# Patient Record
Sex: Male | Born: 1980 | Hispanic: Yes | Marital: Single
Health system: Southern US, Community
[De-identification: ages and names within clinical notes are randomized; demographics above are authoritative.]

---

## 2011-10-30 ENCOUNTER — Inpatient Hospital Stay: Payer: Self-pay | Admitting: Internal Medicine

## 2011-10-30 LAB — COMPREHENSIVE METABOLIC PANEL
Albumin: 1.8 g/dL — ABNORMAL LOW (ref 3.4–5.0)
Alkaline Phosphatase: 283 U/L — ABNORMAL HIGH (ref 50–136)
Bilirubin,Total: 19.8 mg/dL — ABNORMAL HIGH (ref 0.2–1.0)
Chloride: 95 mmol/L — ABNORMAL LOW (ref 98–107)
Creatinine: 0.34 mg/dL — ABNORMAL LOW (ref 0.60–1.30)
EGFR (African American): >60
EGFR (Non-African Amer.): >60
Glucose: 93 mg/dL (ref 65–99)
Potassium: 3.3 mmol/L — ABNORMAL LOW (ref 3.5–5.1)
SGOT(AST): 188 U/L — ABNORMAL HIGH (ref 15–37)
SGPT (ALT): 32 U/L (ref 12–78)
Sodium: 130 mmol/L — ABNORMAL LOW (ref 136–145)
Total Protein: 7.3 g/dL (ref 6.4–8.2)

## 2011-10-30 LAB — URINALYSIS, COMPLETE
Bacteria: NONE SEEN
Glucose,UR: NEGATIVE mg/dL (ref 0–75)
Nitrite: NEGATIVE
Protein: 30
Specific Gravity: 1.017 (ref 1.003–1.030)
Squamous Epithelial: NONE SEEN

## 2011-10-30 LAB — PROTIME-INR
INR: 1.7
Prothrombin Time: 20.7 secs — ABNORMAL HIGH (ref 11.5–14.7)

## 2011-10-30 LAB — CBC
HCT: 33.1 % — ABNORMAL LOW (ref 40.0–52.0)
MCH: 32.9 pg (ref 26.0–34.0)
MCHC: 34.3 g/dL (ref 32.0–36.0)
RDW: 16.9 % — ABNORMAL HIGH (ref 11.5–14.5)

## 2011-10-30 LAB — AMMONIA: Ammonia, Plasma: 46 mcmol/L — ABNORMAL HIGH (ref 11–32)

## 2011-10-31 LAB — COMPREHENSIVE METABOLIC PANEL
Alkaline Phosphatase: 237 U/L — ABNORMAL HIGH (ref 50–136)
Anion Gap: 11 (ref 7–16)
BUN: 5 mg/dL — ABNORMAL LOW (ref 7–18)
Chloride: 98 mmol/L (ref 98–107)
Co2: 26 mmol/L (ref 21–32)
Creatinine: 0.5 mg/dL — ABNORMAL LOW (ref 0.60–1.30)
EGFR (African American): >60
Potassium: 2.9 mmol/L — ABNORMAL LOW (ref 3.5–5.1)
SGPT (ALT): 28 U/L (ref 12–78)
Sodium: 135 mmol/L — ABNORMAL LOW (ref 136–145)
Total Protein: 6.2 g/dL — ABNORMAL LOW (ref 6.4–8.2)

## 2011-10-31 LAB — CBC WITH DIFFERENTIAL/PLATELET
Basophil #: 0.1 10*3/uL (ref 0.0–0.1)
Eosinophil #: 0.3 10*3/uL (ref 0.0–0.7)
MCHC: 34.8 g/dL (ref 32.0–36.0)
MCV: 96 fL (ref 80–100)
Monocyte #: 1.5 x10 3/mm — ABNORMAL HIGH (ref 0.2–1.0)
Monocyte %: 10.9 %
Neutrophil #: 10.7 10*3/uL — ABNORMAL HIGH (ref 1.4–6.5)
Neutrophil %: 78.6 %
Platelet: 113 10*3/uL — ABNORMAL LOW (ref 150–440)
RBC: 3.31 10*6/uL — ABNORMAL LOW (ref 4.40–5.90)
WBC: 13.6 10*3/uL — ABNORMAL HIGH (ref 3.8–10.6)

## 2011-11-01 LAB — BASIC METABOLIC PANEL
BUN: 6 mg/dL — ABNORMAL LOW (ref 7–18)
Creatinine: 0.46 mg/dL — ABNORMAL LOW (ref 0.60–1.30)
EGFR (African American): >60
EGFR (Non-African Amer.): >60
Glucose: 84 mg/dL (ref 65–99)
Potassium: 3.3 mmol/L — ABNORMAL LOW (ref 3.5–5.1)
Sodium: 134 mmol/L — ABNORMAL LOW (ref 136–145)

## 2011-11-01 LAB — HEPATIC FUNCTION PANEL A (ARMC)
Bilirubin, Direct: 13 mg/dL — ABNORMAL HIGH (ref 0.00–0.20)
Bilirubin,Total: 17.1 mg/dL — ABNORMAL HIGH (ref 0.2–1.0)
SGOT(AST): 104 U/L — ABNORMAL HIGH (ref 15–37)

## 2011-11-01 LAB — CLOSTRIDIUM DIFFICILE BY PCR

## 2011-11-01 LAB — PROTIME-INR
INR: 2
Prothrombin Time: 22.9 secs — ABNORMAL HIGH (ref 11.5–14.7)

## 2011-11-02 LAB — COMPREHENSIVE METABOLIC PANEL
Albumin: 1.6 g/dL — ABNORMAL LOW (ref 3.4–5.0)
Alkaline Phosphatase: 247 U/L — ABNORMAL HIGH (ref 50–136)
BUN: 6 mg/dL — ABNORMAL LOW (ref 7–18)
Bilirubin,Total: 19.4 mg/dL — ABNORMAL HIGH (ref 0.2–1.0)
Co2: 24 mmol/L (ref 21–32)
Creatinine: 0.46 mg/dL — ABNORMAL LOW (ref 0.60–1.30)
EGFR (Non-African Amer.): >60
Glucose: 68 mg/dL (ref 65–99)
Osmolality: 273 (ref 275–301)
SGOT(AST): 91 U/L — ABNORMAL HIGH (ref 15–37)
SGPT (ALT): 28 U/L (ref 12–78)
Sodium: 139 mmol/L (ref 136–145)
Total Protein: 6.7 g/dL (ref 6.4–8.2)

## 2011-11-03 LAB — COMPREHENSIVE METABOLIC PANEL
Alkaline Phosphatase: 238 U/L — ABNORMAL HIGH (ref 50–136)
Anion Gap: 11 (ref 7–16)
BUN: 7 mg/dL (ref 7–18)
Bilirubin,Total: 19.6 mg/dL — ABNORMAL HIGH (ref 0.2–1.0)
Calcium, Total: 8.2 mg/dL — ABNORMAL LOW (ref 8.5–10.1)
Chloride: 102 mmol/L (ref 98–107)
Co2: 23 mmol/L (ref 21–32)
Creatinine: 0.51 mg/dL — ABNORMAL LOW (ref 0.60–1.30)
Glucose: 81 mg/dL (ref 65–99)
Osmolality: 269 (ref 275–301)
Potassium: 3.6 mmol/L (ref 3.5–5.1)
Sodium: 136 mmol/L (ref 136–145)

## 2011-11-04 LAB — COMPREHENSIVE METABOLIC PANEL
Alkaline Phosphatase: 240 U/L — ABNORMAL HIGH (ref 50–136)
BUN: 8 mg/dL (ref 7–18)
Bilirubin,Total: 18.9 mg/dL — ABNORMAL HIGH (ref 0.2–1.0)
Chloride: 103 mmol/L (ref 98–107)
Co2: 22 mmol/L (ref 21–32)
Creatinine: 0.48 mg/dL — ABNORMAL LOW (ref 0.60–1.30)
EGFR (Non-African Amer.): >60
SGOT(AST): 62 U/L — ABNORMAL HIGH (ref 15–37)
SGPT (ALT): 27 U/L (ref 12–78)
Total Protein: 6.4 g/dL (ref 6.4–8.2)

## 2011-11-04 LAB — CBC WITH DIFFERENTIAL/PLATELET
Bands: 9 %
Eosinophil: 1 %
HCT: 35 % — ABNORMAL LOW (ref 40.0–52.0)
HGB: 11.9 g/dL — ABNORMAL LOW (ref 13.0–18.0)
MCHC: 34 g/dL (ref 32.0–36.0)
Platelet: 198 10*3/uL (ref 150–440)
Segmented Neutrophils: 69 %

## 2011-11-18 ENCOUNTER — Inpatient Hospital Stay: Payer: Self-pay | Admitting: Internal Medicine

## 2011-11-18 LAB — ETHANOL
Ethanol %: 0.037 % (ref 0.000–0.080)
Ethanol: 37 mg/dL

## 2011-11-18 LAB — CBC
HCT: 33.7 % — ABNORMAL LOW (ref 40.0–52.0)
HGB: 11.6 g/dL — ABNORMAL LOW (ref 13.0–18.0)
MCH: 33 pg (ref 26.0–34.0)
MCHC: 34.5 g/dL (ref 32.0–36.0)
RDW: 15.6 % — ABNORMAL HIGH (ref 11.5–14.5)

## 2011-11-18 LAB — URINALYSIS, COMPLETE
Cellular Cast: 1
Leukocyte Esterase: NEGATIVE
Nitrite: NEGATIVE
Ph: 6 (ref 4.5–8.0)
Protein: 25
RBC,UR: 3 /HPF (ref 0–5)
WBC UR: 5 /HPF (ref 0–5)

## 2011-11-18 LAB — LIPASE, BLOOD: Lipase: 357 U/L (ref 73–393)

## 2011-11-18 LAB — COMPREHENSIVE METABOLIC PANEL
Alkaline Phosphatase: 307 U/L — ABNORMAL HIGH (ref 50–136)
BUN: 7 mg/dL (ref 7–18)
Bilirubin,Total: 18.2 mg/dL — ABNORMAL HIGH (ref 0.2–1.0)
Chloride: 106 mmol/L (ref 98–107)
Co2: 18 mmol/L — ABNORMAL LOW (ref 21–32)
EGFR (African American): >60
EGFR (Non-African Amer.): >60
SGOT(AST): 156 U/L — ABNORMAL HIGH (ref 15–37)
SGPT (ALT): 71 U/L (ref 12–78)
Total Protein: 6.8 g/dL (ref 6.4–8.2)

## 2011-11-18 LAB — AMMONIA: Ammonia, Plasma: 53 mcmol/L — ABNORMAL HIGH (ref 11–32)

## 2011-11-19 LAB — CBC WITH DIFFERENTIAL/PLATELET
Basophil %: 0.4 %
Eosinophil %: 0.2 %
HCT: 31.8 % — ABNORMAL LOW (ref 40.0–52.0)
HGB: 11.1 g/dL — ABNORMAL LOW (ref 13.0–18.0)
Lymphocyte %: 5.3 %
MCH: 33.6 pg (ref 26.0–34.0)
MCHC: 34.9 g/dL (ref 32.0–36.0)
Monocyte %: 1.1 %
Neutrophil #: 14.1 10*3/uL — ABNORMAL HIGH (ref 1.4–6.5)
Neutrophil %: 93 %
RBC: 3.3 10*6/uL — ABNORMAL LOW (ref 4.40–5.90)
WBC: 15.2 10*3/uL — ABNORMAL HIGH (ref 3.8–10.6)

## 2011-11-19 LAB — COMPREHENSIVE METABOLIC PANEL
Alkaline Phosphatase: 280 U/L — ABNORMAL HIGH (ref 50–136)
Calcium, Total: 7.2 mg/dL — ABNORMAL LOW (ref 8.5–10.1)
Chloride: 105 mmol/L (ref 98–107)
Co2: 19 mmol/L — ABNORMAL LOW (ref 21–32)
EGFR (African American): >60
EGFR (Non-African Amer.): >60
Osmolality: 268 (ref 275–301)
Potassium: 3.5 mmol/L (ref 3.5–5.1)
SGOT(AST): 138 U/L — ABNORMAL HIGH (ref 15–37)
SGPT (ALT): 64 U/L (ref 12–78)
Sodium: 134 mmol/L — ABNORMAL LOW (ref 136–145)
Total Protein: 6.1 g/dL — ABNORMAL LOW (ref 6.4–8.2)

## 2011-11-19 LAB — MAGNESIUM: Magnesium: 1.7 mg/dL — ABNORMAL LOW

## 2011-11-19 LAB — URINE CULTURE

## 2011-11-20 LAB — COMPREHENSIVE METABOLIC PANEL
Anion Gap: 11 (ref 7–16)
Chloride: 109 mmol/L — ABNORMAL HIGH (ref 98–107)
Co2: 17 mmol/L — ABNORMAL LOW (ref 21–32)
Creatinine: 0.48 mg/dL — ABNORMAL LOW (ref 0.60–1.30)
EGFR (African American): >60
EGFR (Non-African Amer.): >60
Potassium: 3.8 mmol/L (ref 3.5–5.1)
SGOT(AST): 83 U/L — ABNORMAL HIGH (ref 15–37)
SGPT (ALT): 50 U/L (ref 12–78)
Sodium: 137 mmol/L (ref 136–145)
Total Protein: 5.6 g/dL — ABNORMAL LOW (ref 6.4–8.2)

## 2011-11-20 LAB — CBC WITH DIFFERENTIAL/PLATELET
Basophil #: 0 10*3/uL (ref 0.0–0.1)
Basophil %: 0.1 %
Eosinophil #: 0 10*3/uL (ref 0.0–0.7)
Lymphocyte #: 1.1 10*3/uL (ref 1.0–3.6)
Lymphocyte %: 4.4 %
MCH: 31.7 pg (ref 26.0–34.0)
MCHC: 32.7 g/dL (ref 32.0–36.0)
Monocyte #: 1.4 x10 3/mm — ABNORMAL HIGH (ref 0.2–1.0)
Neutrophil %: 90.1 %
RDW: 15.1 % — ABNORMAL HIGH (ref 11.5–14.5)

## 2011-11-20 LAB — CLOSTRIDIUM DIFFICILE BY PCR

## 2011-11-20 LAB — MAGNESIUM: Magnesium: 1.6 mg/dL — ABNORMAL LOW

## 2011-11-22 LAB — COMPREHENSIVE METABOLIC PANEL
Alkaline Phosphatase: 230 U/L — ABNORMAL HIGH (ref 50–136)
Anion Gap: 11 (ref 7–16)
Bilirubin,Total: 13.7 mg/dL — ABNORMAL HIGH (ref 0.2–1.0)
Calcium, Total: 7.3 mg/dL — ABNORMAL LOW (ref 8.5–10.1)
Chloride: 110 mmol/L — ABNORMAL HIGH (ref 98–107)
Co2: 18 mmol/L — ABNORMAL LOW (ref 21–32)
Creatinine: 0.58 mg/dL — ABNORMAL LOW (ref 0.60–1.30)
EGFR (African American): >60
Potassium: 3.4 mmol/L — ABNORMAL LOW (ref 3.5–5.1)
SGPT (ALT): 49 U/L (ref 12–78)

## 2011-11-22 LAB — CBC WITH DIFFERENTIAL/PLATELET
Basophil #: 0 10*3/uL (ref 0.0–0.1)
Basophil %: 0.1 %
Eosinophil #: 0.7 10*3/uL (ref 0.0–0.7)
Eosinophil %: 3.8 %
HGB: 10.9 g/dL — ABNORMAL LOW (ref 13.0–18.0)
Lymphocyte #: 1.7 10*3/uL (ref 1.0–3.6)
MCH: 32.9 pg (ref 26.0–34.0)
MCHC: 34.1 g/dL (ref 32.0–36.0)
MCV: 96 fL (ref 80–100)
Monocyte #: 1.5 x10 3/mm — ABNORMAL HIGH (ref 0.2–1.0)
Platelet: 125 10*3/uL — ABNORMAL LOW (ref 150–440)
RDW: 15.1 % — ABNORMAL HIGH (ref 11.5–14.5)

## 2011-11-23 LAB — COMPREHENSIVE METABOLIC PANEL
Albumin: 1.3 g/dL — ABNORMAL LOW (ref 3.4–5.0)
Alkaline Phosphatase: 263 U/L — ABNORMAL HIGH (ref 50–136)
BUN: 10 mg/dL (ref 7–18)
Bilirubin,Total: 13.6 mg/dL — ABNORMAL HIGH (ref 0.2–1.0)
Co2: 16 mmol/L — ABNORMAL LOW (ref 21–32)
EGFR (African American): >60
Glucose: 93 mg/dL (ref 65–99)
Osmolality: 276 (ref 275–301)
Potassium: 4 mmol/L (ref 3.5–5.1)
SGPT (ALT): 49 U/L (ref 12–78)
Total Protein: 5.9 g/dL — ABNORMAL LOW (ref 6.4–8.2)

## 2011-11-23 LAB — CBC WITH DIFFERENTIAL/PLATELET
Basophil #: 0 10*3/uL (ref 0.0–0.1)
Eosinophil #: 0.6 10*3/uL (ref 0.0–0.7)
HCT: 32.6 % — ABNORMAL LOW (ref 40.0–52.0)
HGB: 11.2 g/dL — ABNORMAL LOW (ref 13.0–18.0)
Lymphocyte #: 1.5 10*3/uL (ref 1.0–3.6)
MCH: 33.2 pg (ref 26.0–34.0)
MCHC: 34.2 g/dL (ref 32.0–36.0)
MCV: 97 fL (ref 80–100)
Monocyte #: 1.4 x10 3/mm — ABNORMAL HIGH (ref 0.2–1.0)
Neutrophil #: 13.9 10*3/uL — ABNORMAL HIGH (ref 1.4–6.5)
Neutrophil %: 79.6 %
RDW: 15.2 % — ABNORMAL HIGH (ref 11.5–14.5)
WBC: 17.5 10*3/uL — ABNORMAL HIGH (ref 3.8–10.6)

## 2011-11-24 LAB — CULTURE, BLOOD (SINGLE)

## 2013-05-09 IMAGING — US ABDOMEN ULTRASOUND
1 series · 13 of 25 positions shown · non-contrast
Comparison: none

REASON FOR EXAM: Jaundice, abnormal LFT's.
COMMENTS:

PROCEDURE:     US  - US ABDOMEN GENERAL SURVEY  - October 31, 2011  [DATE]
RESULT:     Comparison: None.
TECHNIQUE: Multiple grayscale and color Doppler images were obtained of the
abdomen.

[Series 1: abdomen ultrasound · 0.31mm/px · 13 of 97 slices shown]
[im 1/97]
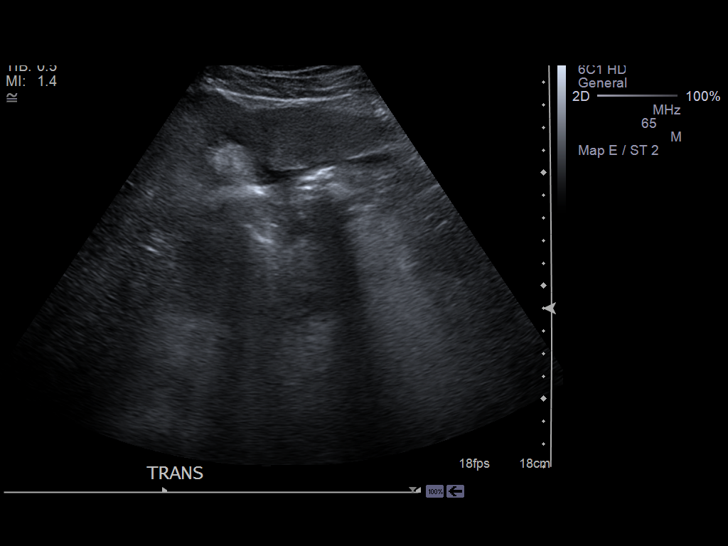
[im 9/97]
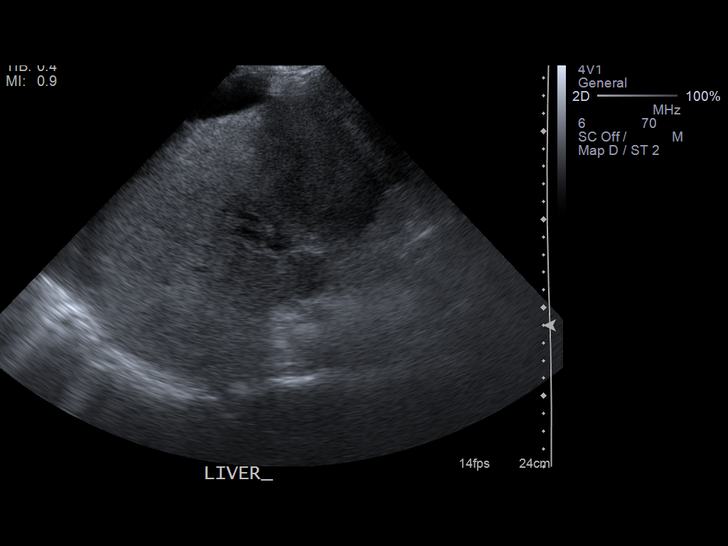
[im 17/97]
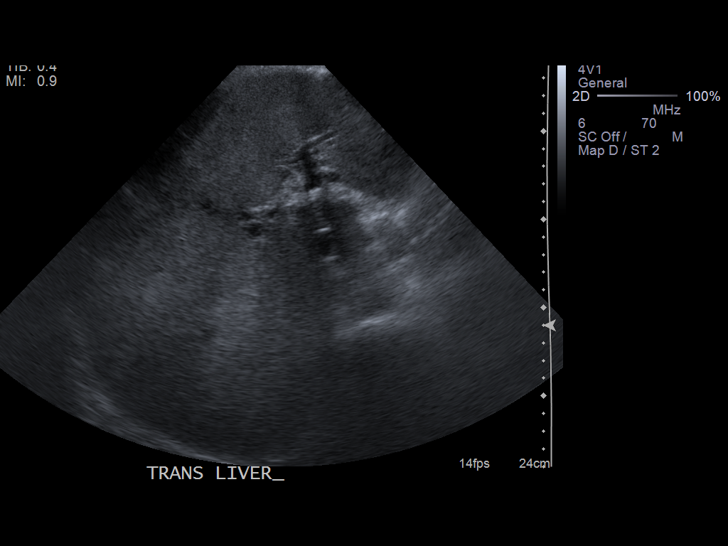
[im 25/97]
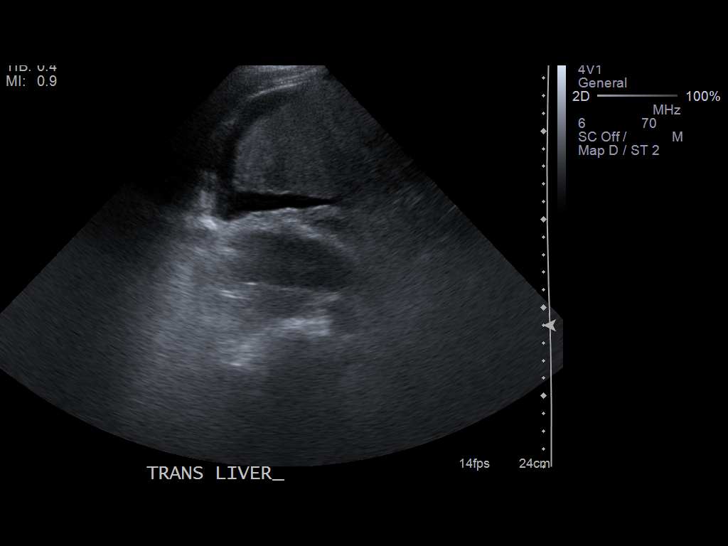
[im 33/97]
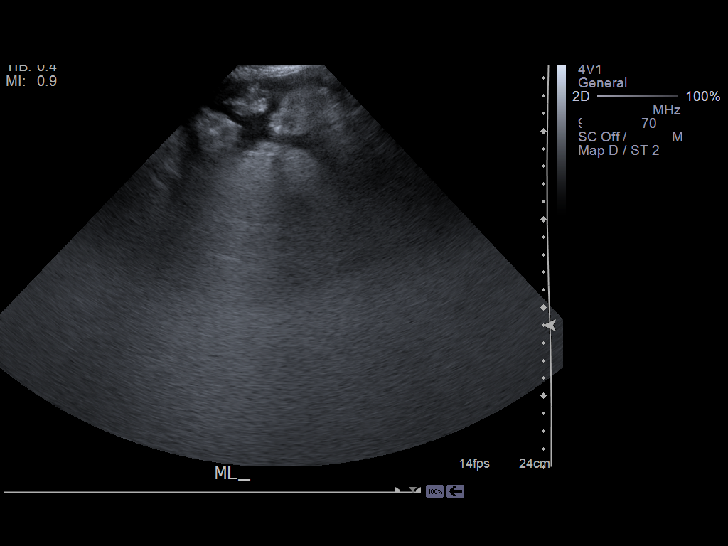
[im 41/97]
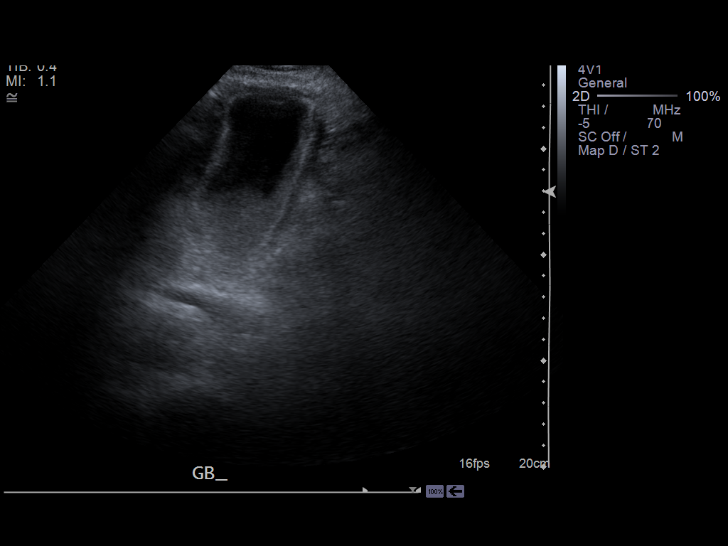
[im 49/97]
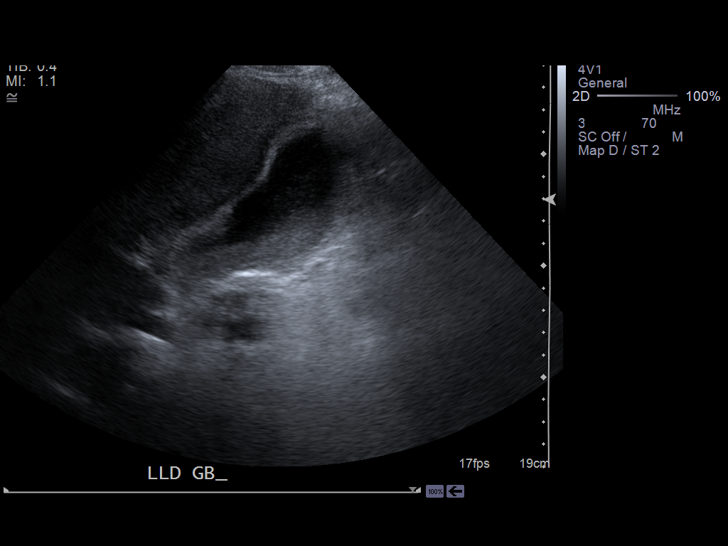
[im 57/97]
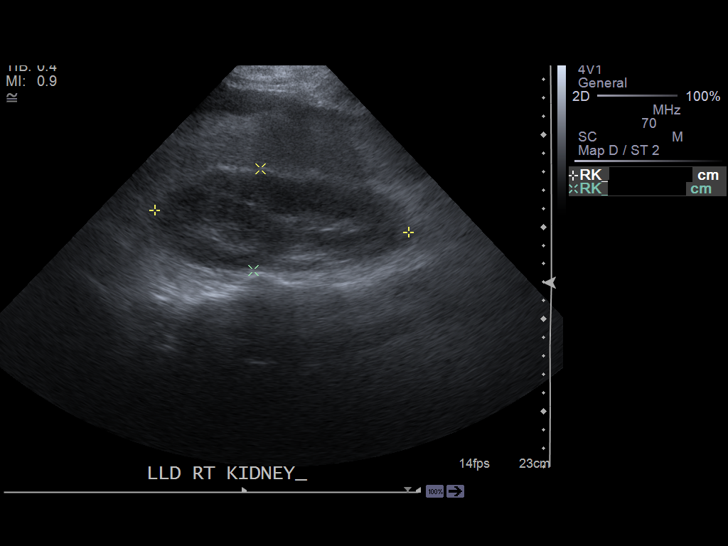
[im 65/97]
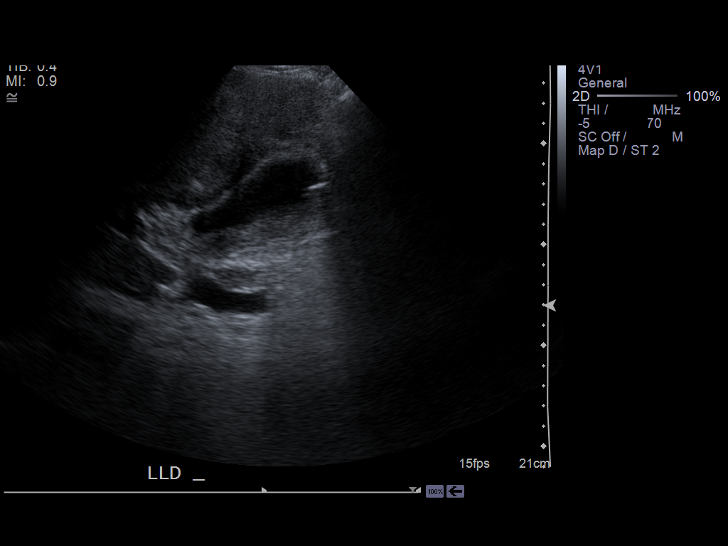
[im 73/97]
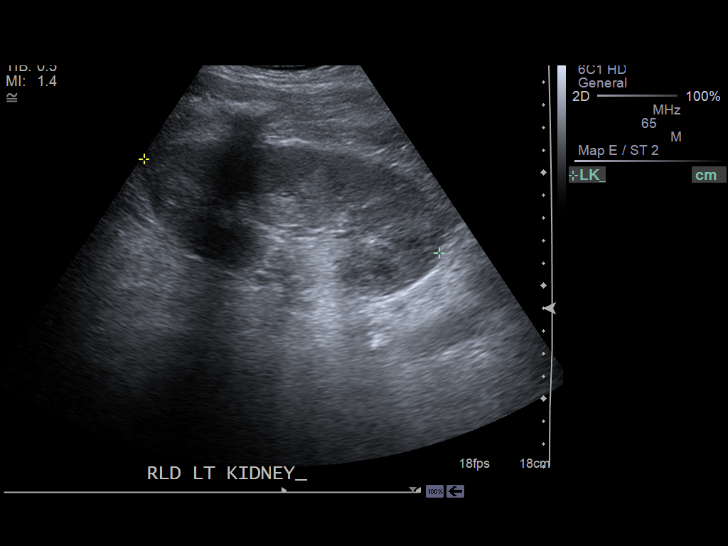
[im 81/97]
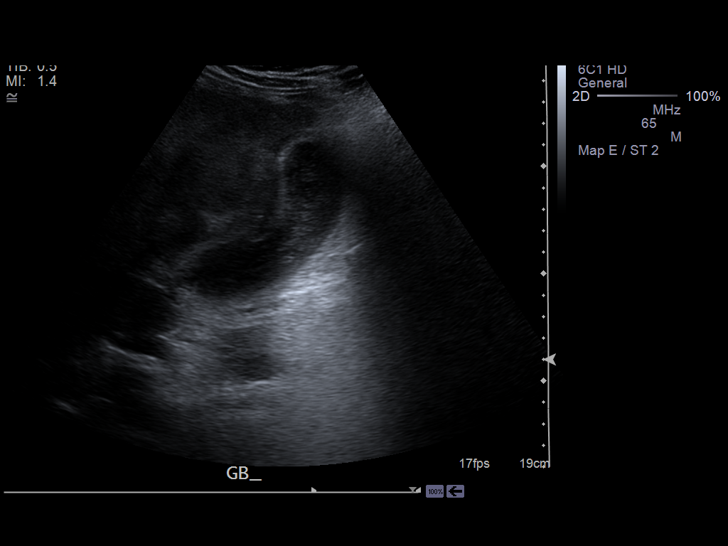
[im 89/97]
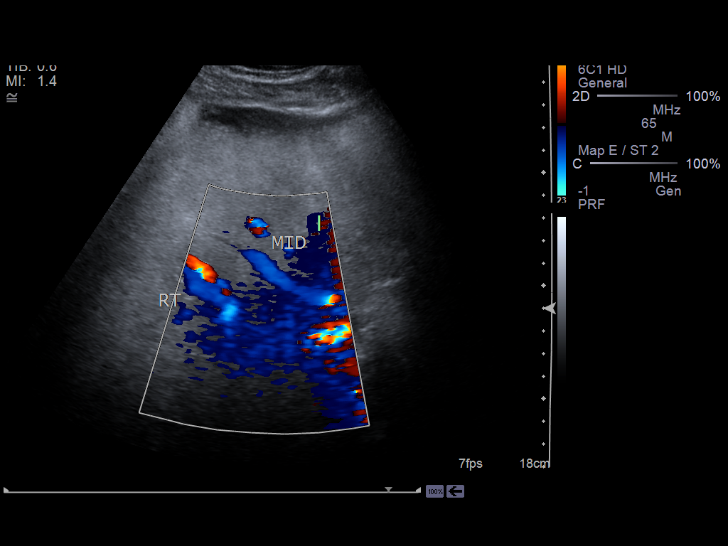
[im 97/97]
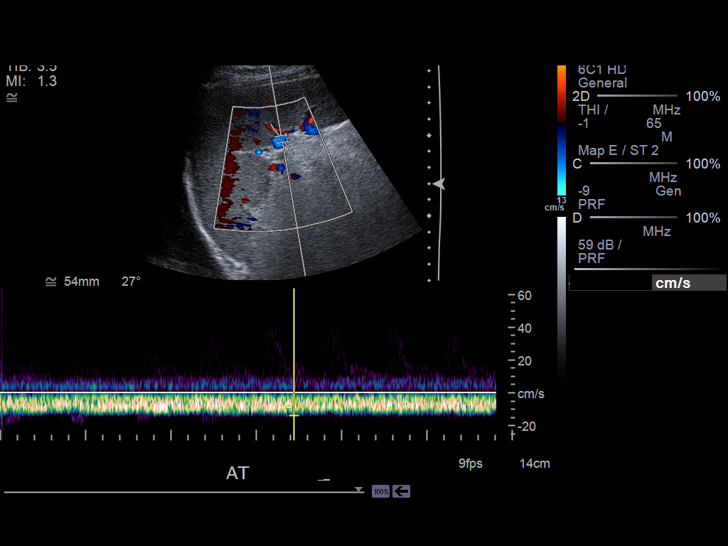

[13 of 25 positions shown; findings below may reference images not displayed]

FINDINGS: The liver is heterogeneous in echotexture. The contour appears somewhat
nodular. Findings are concerning for cirrhosis. There is a small amount of
ascites present. There is reversal of flow in the main portal vein. Flow is
demonstrated in the hepatic veins, though there is lack of respiratory
pulsatility.

There are stones and sludge in the gallbladder. The gallbladder wall is at
the upper limits of normal, measuring 3 mm in thickness. The sonographic
Murphy sign was negative. The common bile duct measures 4 mm.

The spleen is borderline enlarged, measuring 13.6 cm.  The flow is seen in
the splenic vein.

Images of the kidneys showed no hydronephrosis. The pancreas was obscured by
overlying bowel gas.
IMPRESSION: 1. Findings of cirrhosis with reversal of flow in the portal vein as well as
mild splenomegaly.
2. Cholelithiasis and sludge in gallbladder. The gallbladder wall is at the
upper limits of normal in thickness, although the sonographic Murphy sign
was negative. Causes of gallbladder wall thickening include cirrhosis,
ascites, cholecystitis, among others. Correlate clinically.
3. Mild amount of ascites.

[REDACTED]

## 2013-05-27 IMAGING — CR DG CHEST 2V
1 series · 2 of 2 positions shown · non-contrast
Comparison: none

REASON FOR EXAM: SOB
COMMENTS:

PROCEDURE:     DXR - DXR CHEST PA (OR AP) AND LATERAL  - November 18, 2011  [DATE]
RESULT:     Comparison: None.

[Series 1: w chest pa · 0.14mm/px · 2 of 2 slices shown]
[im 1/2]
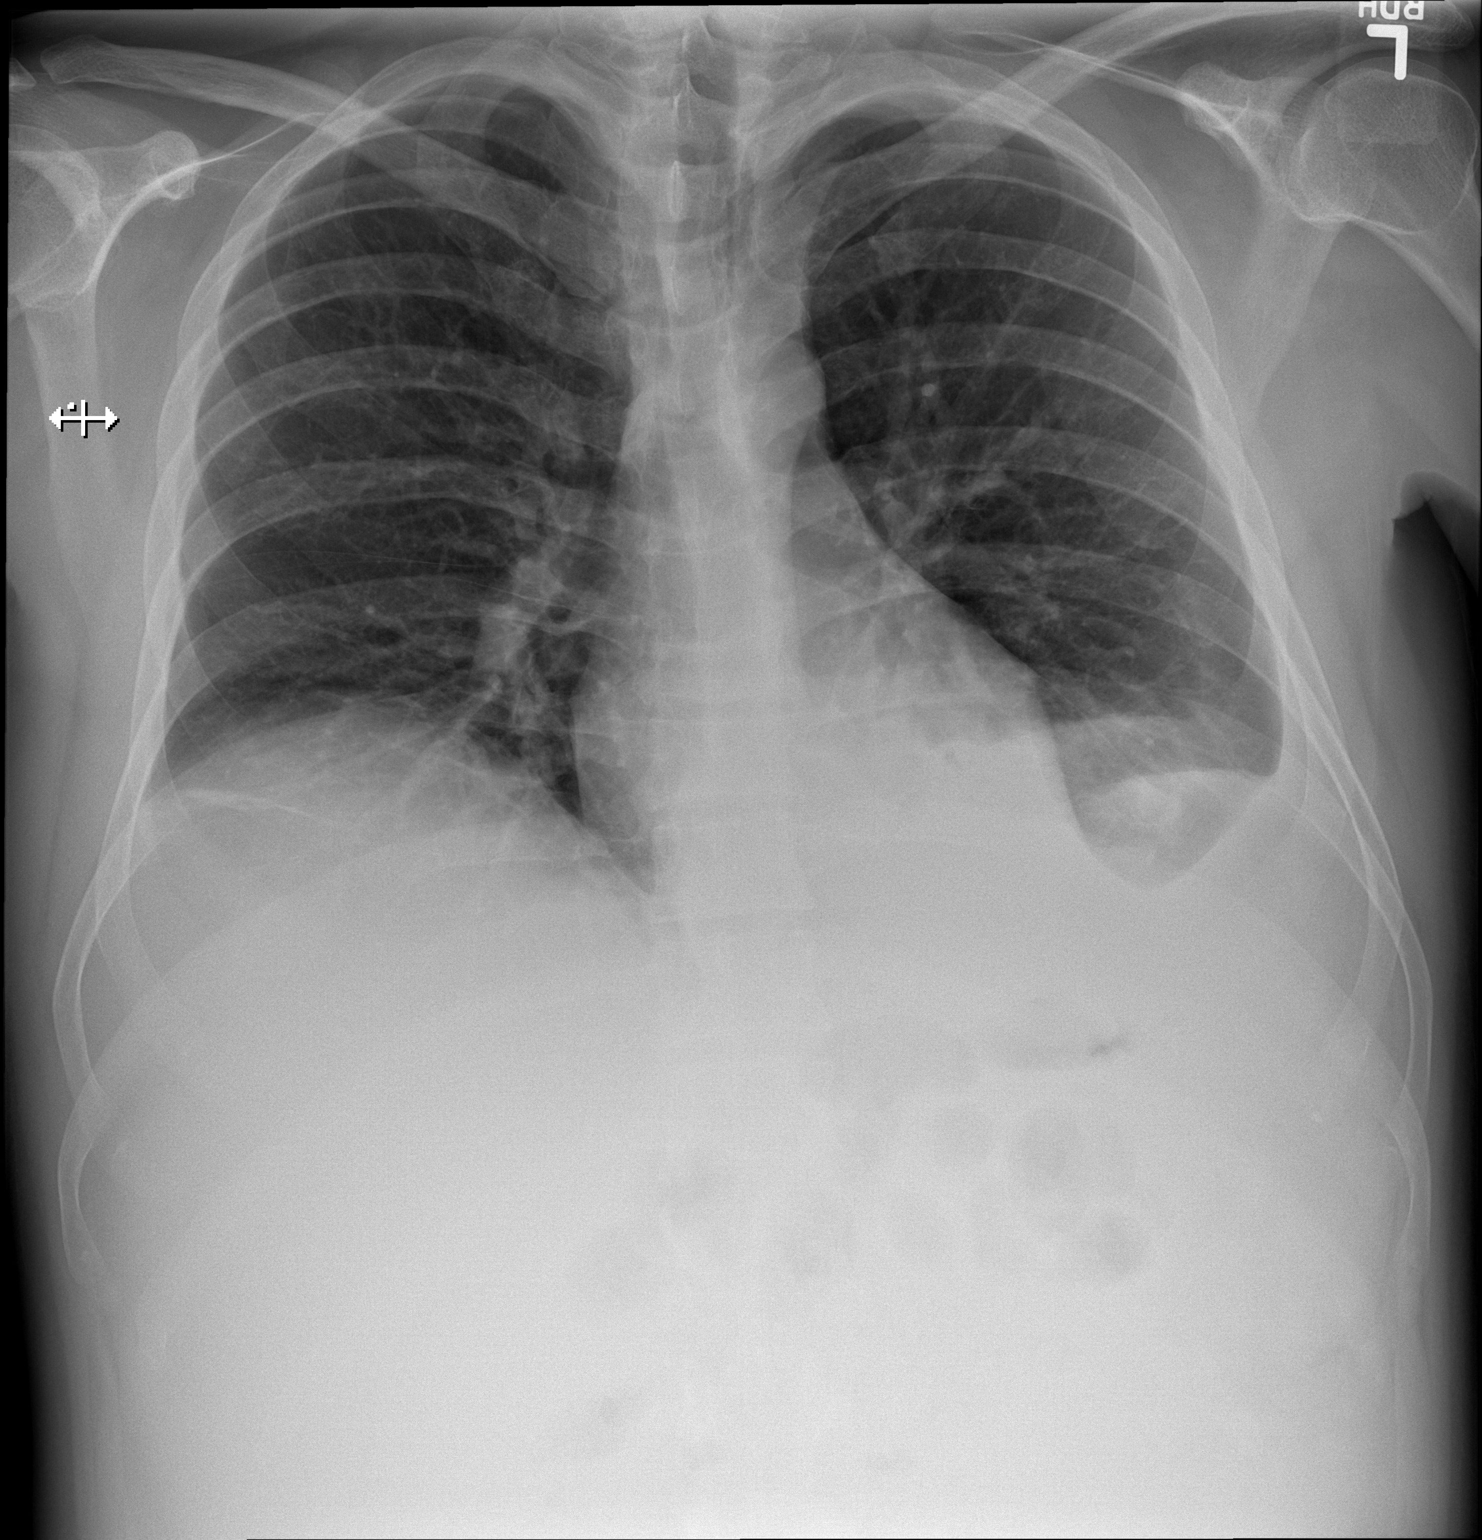
[im 2/2]
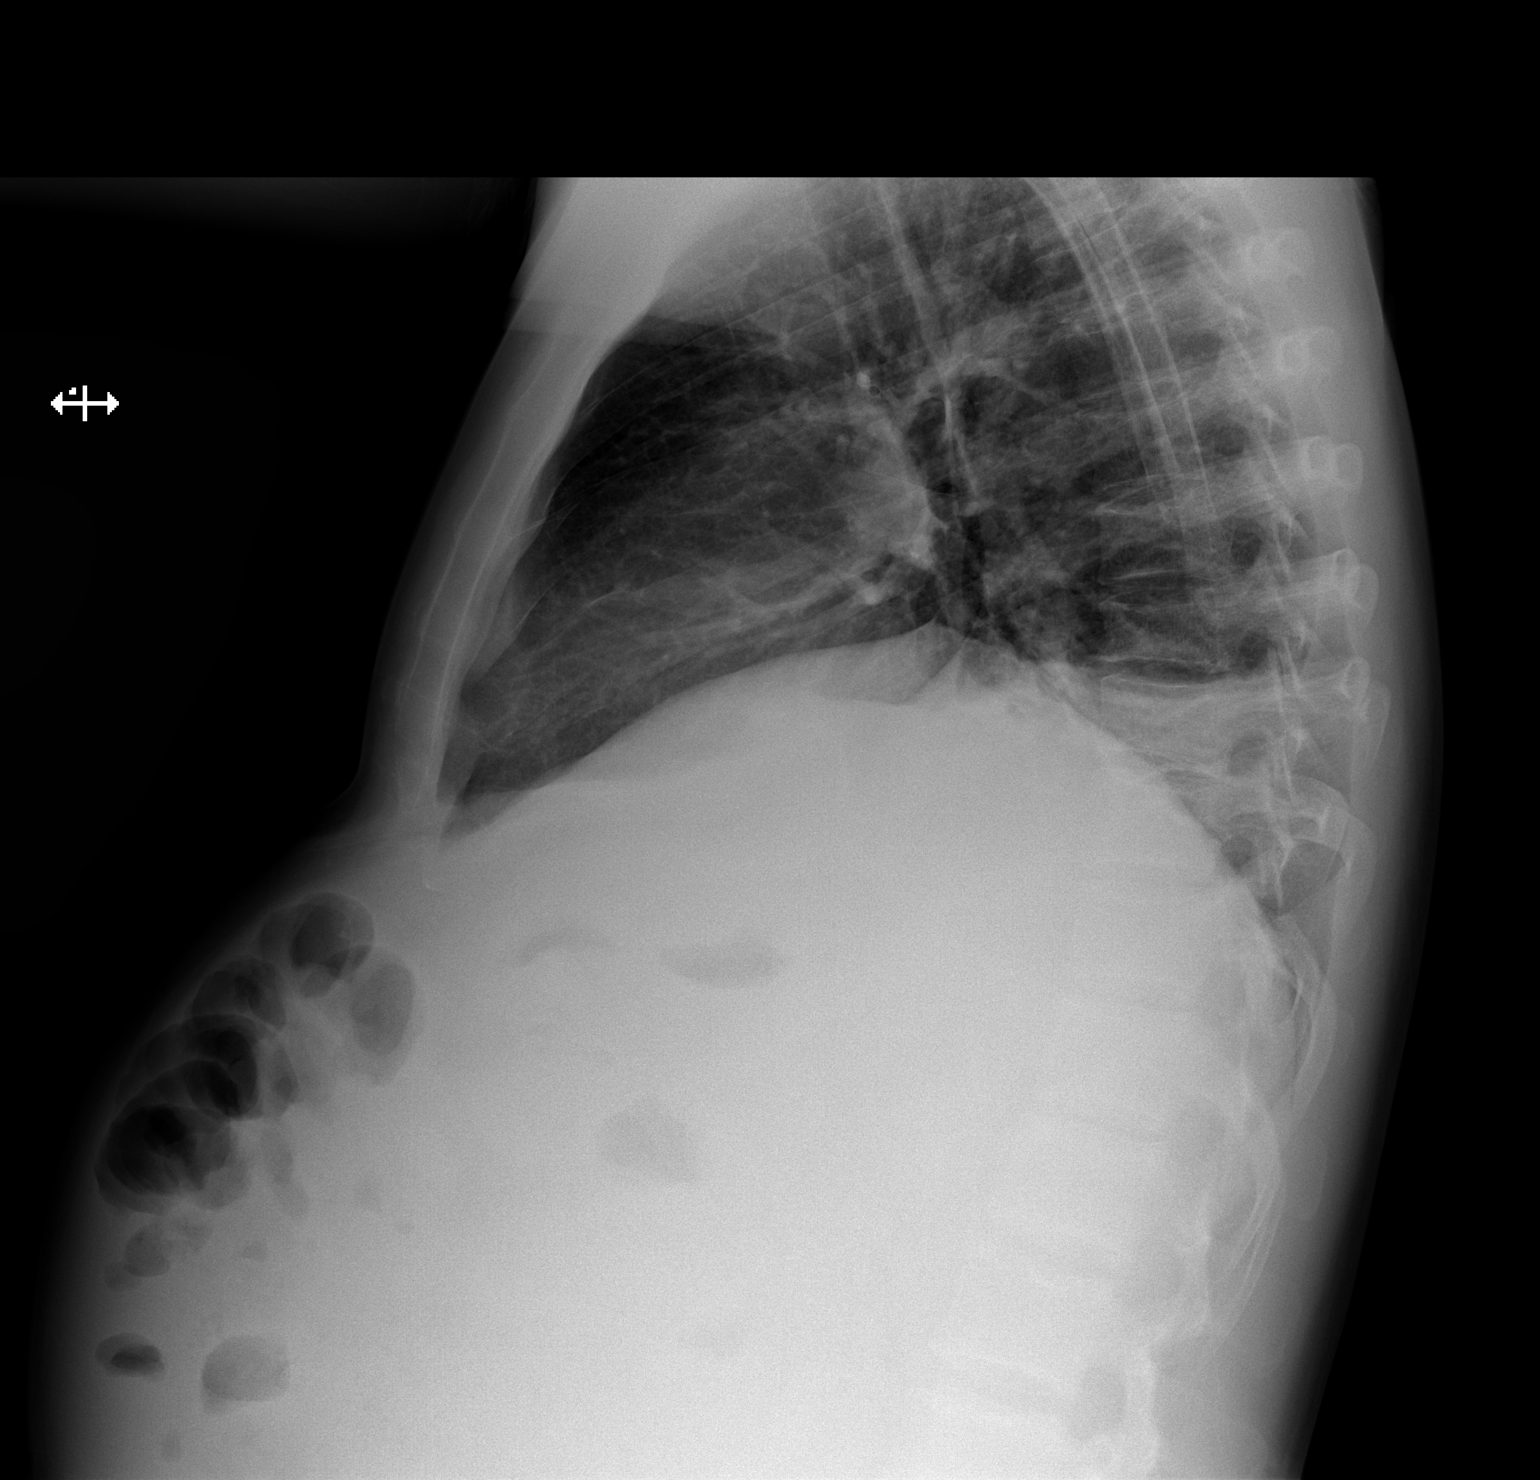

[2 of 2 positions shown; findings below may reference images not displayed]

FINDINGS: The heart and mediastinum are within normal limits. The lung volumes are
low. There is a small left pleural effusion. Mild left basilar opacities may
be secondary to atelectasis. Mild linear right basilar opacity likely
secondary to subsegmental atelectasis.
IMPRESSION: Small left pleural effusion. Mild left basilar opacities may be secondary to
atelectasis. Underlying parenchymal abnormality not excluded. Followup PA
and lateral chest radiograph is recommended.

[REDACTED]

## 2013-05-27 IMAGING — US ABDOMEN ULTRASOUND
1 series · 13 of 25 positions shown · non-contrast
Comparison: none

REASON FOR EXAM: abd. pain, jaundice
COMMENTS:   May transport without cardiac monitor

[Series 1: abdomen ultrasound · 0.35mm/px · 13 of 70 slices shown]
[im 1/70]
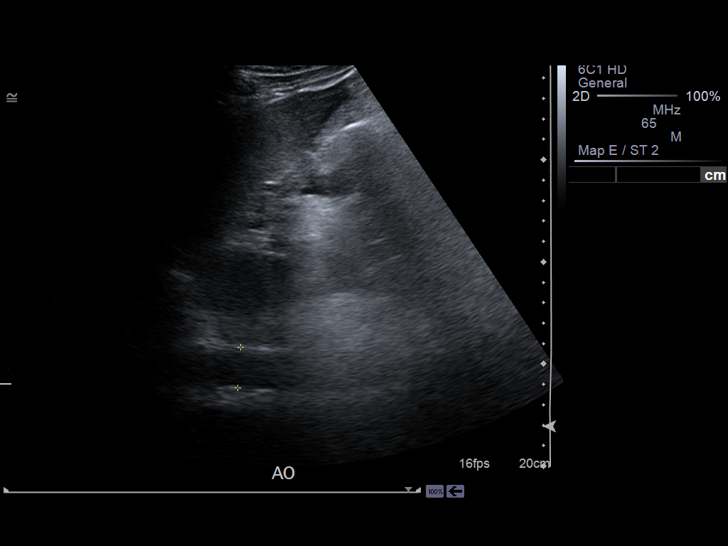
[im 6/70]
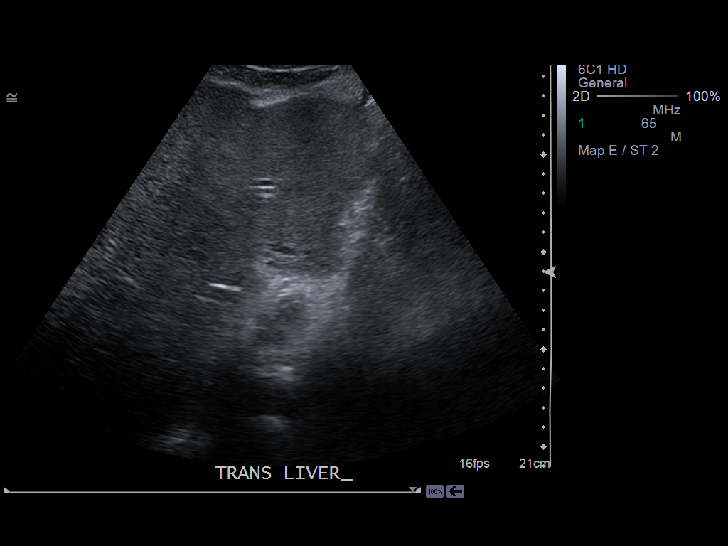
[im 12/70]
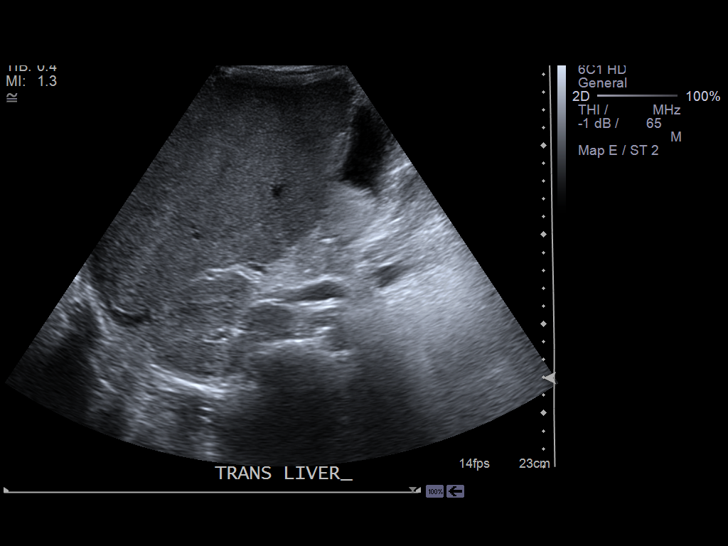
[im 18/70]
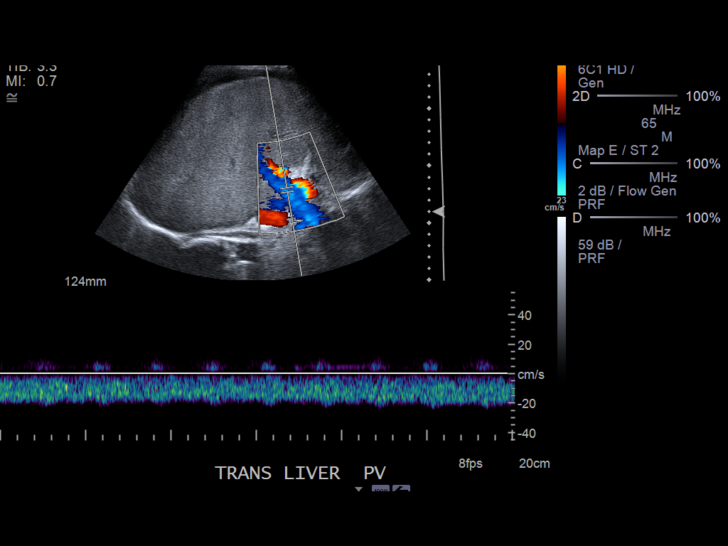
[im 24/70]
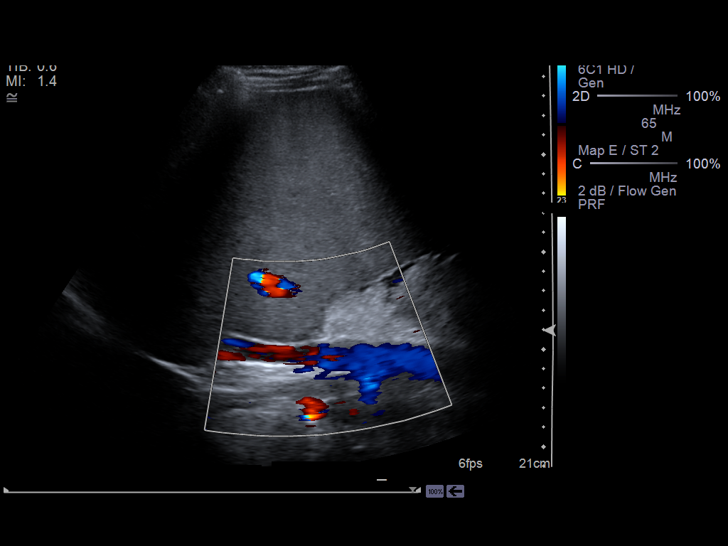
[im 29/70]
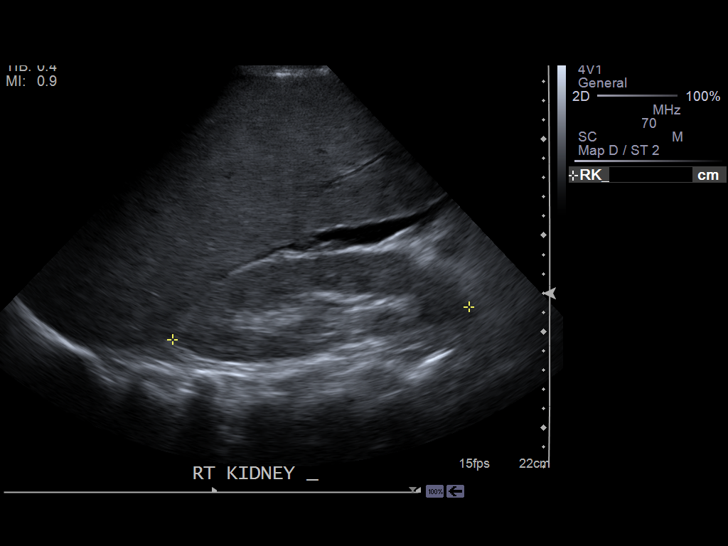
[im 35/70]
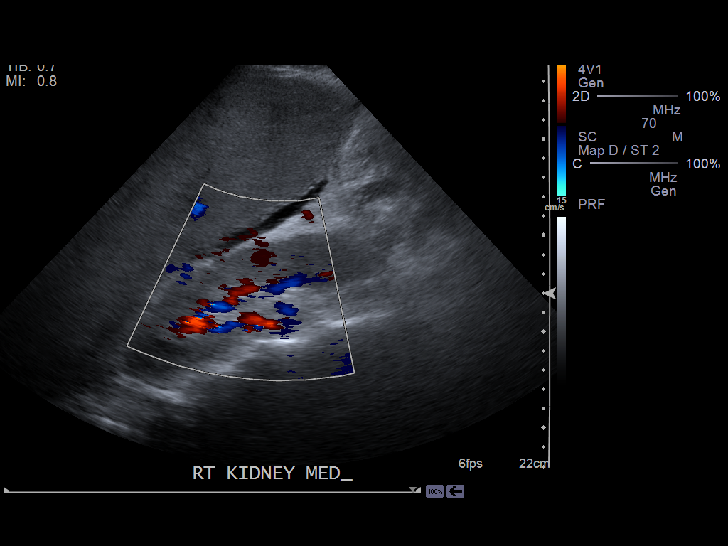
[im 41/70]
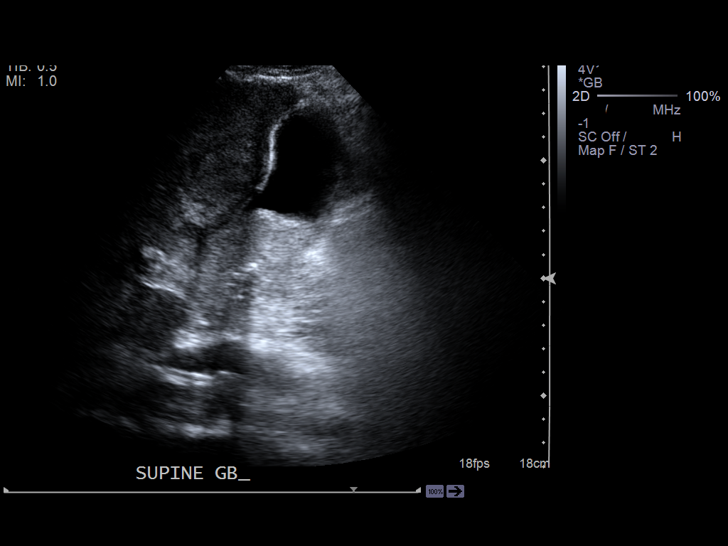
[im 47/70]
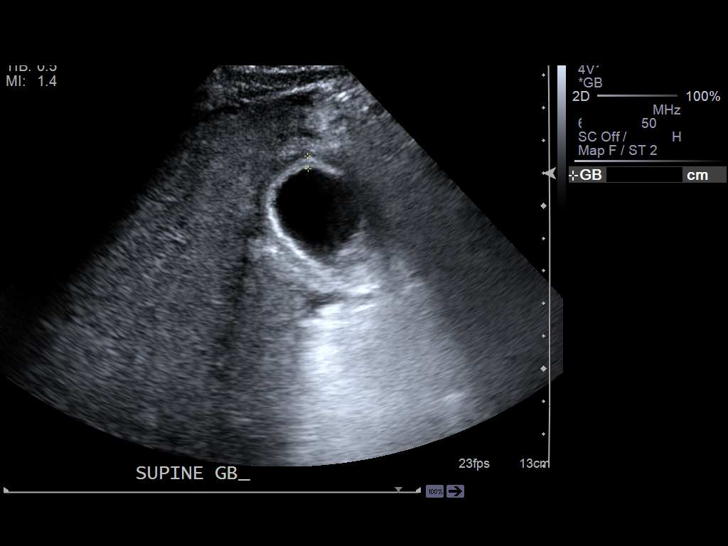
[im 52/70]
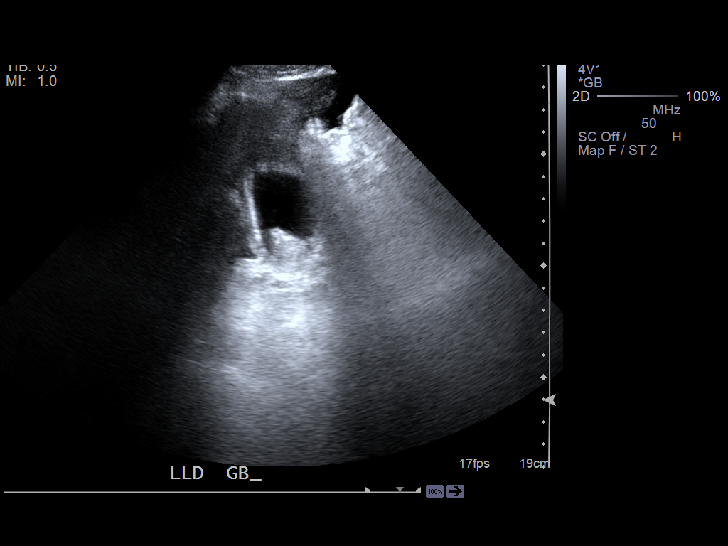
[im 58/70]
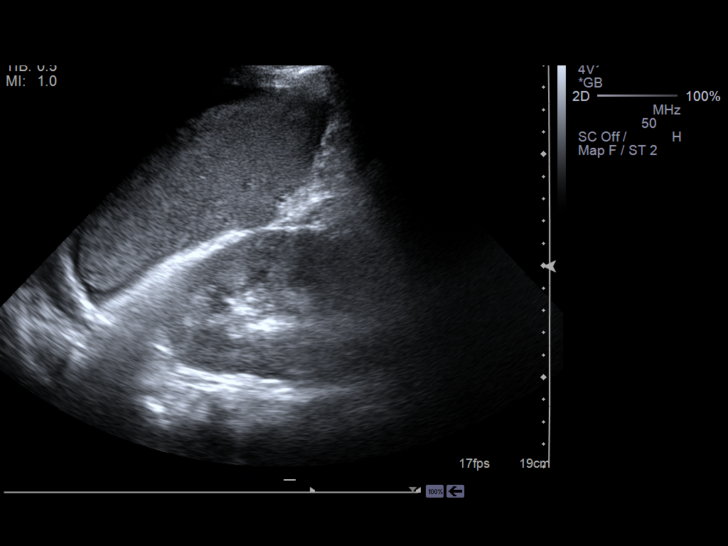
[im 64/70]
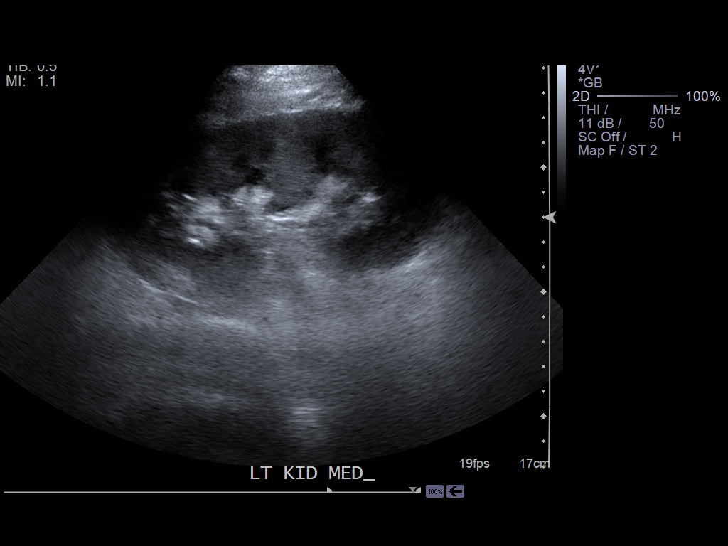
[im 70/70]
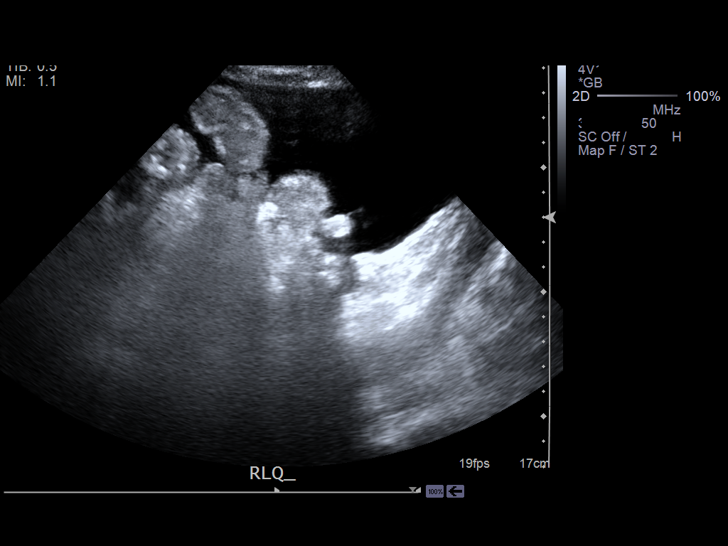

[13 of 25 positions shown; findings below may reference images not displayed]

PROCEDURE:     US  - US ABDOMEN GENERAL SURVEY  - November 18, 2011  [DATE]

RESULT:     Ultrasound of the abdomen is performed. The pancreas is not seen
because of overlying bowel gas. The majority of the aorta is obscured by
overlying bowel gas. The liver demonstrates a heterogeneous appearance. The
liver is enlarged measuring up to at least 22.3 cm and shows a nodular
margin. Hepatofugal flow is noted in the portal vein consistent with portal
hypertension. The right kidney measures 15.49 x 5.66 x 5.08 cm. There is a
small to moderate amount of ascites present. The gallbladder wall measures
3.9 mm in thickness which may be reactive to the ascites. No definite
cholelithiasis is demonstrated. The spleen measures 13.67 cm in length. The
left kidney measures 14.60 x 5.67 x 7.01 cm. The inferior vena cava proximal
portion appears to be patent. The common bile duct diameter is 5.3 mm which
is mildly prominent. No intrahepatic biliary ductal dilation is present.
IMPRESSION: Findings consistent with hepatomegaly and borderline
splenomegaly with evidence of portal venous hypertension and hepatofugal
flow; limited exam otherwise. The kidneys appear grossly normal. The
gallbladder wall is thickened possibly secondary to the mild to moderate
amount of ascites present.

[REDACTED]

## 2014-06-01 NOTE — Consult Note (Signed)
Chief Complaint:   Subjective/Chief Complaint Covering for Dr. Gustavo Lah. No specific complaints. No abd pain. Diarrhea present. Positive c.diff. Jaundiced.   VITAL SIGNS/ANCILLARY NOTES: **Vital Signs.:   21-Sep-13 05:37   Vital Signs Type Routine   Temperature Temperature (F) 98.1   Celsius 36.7   Pulse Pulse 61   Respirations Respirations 20   Systolic BP Systolic BP 021   Diastolic BP (mmHg) Diastolic BP (mmHg) 64   Mean BP 76   Pulse Ox % Pulse Ox % 97   Pulse Ox Activity Level  At rest   Oxygen Delivery Room Air/ 21 %   Brief Assessment:   Cardiac Regular    Respiratory clear BS  no use of accessory muscles    Additional Physical Exam Diffusely jaundiced.   Lab Results: Hepatic:  21-Sep-13 05:12    Bilirubin, Total  19.6   Alkaline Phosphatase  238   SGPT (ALT) 28   SGOT (AST)  72   Total Protein, Serum  6.3   Albumin, Serum  1.5  Routine Chem:  21-Sep-13 05:12    Glucose, Serum 81   BUN 7   Creatinine (comp)  0.51   Sodium, Serum 136   Potassium, Serum 3.6   Chloride, Serum 102   CO2, Serum 23   Calcium (Total), Serum  8.2   Osmolality (calc) 269   eGFR (African American) >60   eGFR (Non-African American) >60 (eGFR values <38m/min/1.73 m2 may be an indication of chronic kidney disease (CKD). Calculated eGFR is useful in patients with stable renal function. The eGFR calculation will not be reliable in acutely ill patients when serum creatinine is changing rapidly. It is not useful in  patients on dialysis. The eGFR calculation may not be applicable to patients at the low and high extremes of body sizes, pregnant women, and vegetarians.)   Anion Gap 11  Routine Coag:  21-Sep-13 05:12    Prothrombin  20.6   INR 1.7 (INR reference interval applies to patients on anticoagulant therapy. A single INR therapeutic range for coumarins is not optimal for all indications; however, the suggested range for most indications is 2.0 - 3.0. Exceptions to the INR  Reference Range may include: Prosthetic heart valves, acute myocardial infarction, prevention of myocardial infarction, and combinations of aspirin and anticoagulant. The need for a higher or lower target INR must be assessed individually. Reference: The Pharmacology and Management of the Vitamin K  antagonists: the seventh ACCP Conference on Antithrombotic and Thrombolytic Therapy. CJDBZM.0802Sept:126 (3suppl): 2N9146842 A HCT value >55% may artifactually increase the PT.  In one study,  the increase was an average of 25%. Reference:  "Effect on Routine and Special Coagulation Testing Values of Citrate Anticoagulant Adjustment in Patients with High HCT Values." American Journal of Clinical Pathology 2006;126:400-405.)   Assessment/Plan:  Assessment/Plan:   Assessment Alcoholic hepatitis. On prednisone. SBP prophylaxis.  C.diff. On flagyl.    Plan Continue prednisone and flagyl. Continue to moniter LFT.   Electronic Signatures: OVerdie Shire(MD)  (Signed 21-Sep-13 09:31)  Authored: Chief Complaint, VITAL SIGNS/ANCILLARY NOTES, Brief Assessment, Lab Results, Assessment/Plan   Last Updated: 21-Sep-13 09:31 by OVerdie Shire(MD)

## 2014-06-01 NOTE — Consult Note (Signed)
Chief Complaint:   Subjective/Chief Complaint stable, denies abdominal pain nausea or vomiting.  no bm overnight.  minimal lower abdominal discomfort. tolerating po.   VITAL SIGNS/ANCILLARY NOTES: **Vital Signs.:   18-Sep-13 06:05   Vital Signs Type Routine   Temperature Temperature (F) 98.6   Celsius 37   Temperature Source Oral   Pulse Pulse 92   Systolic BP Systolic BP 081   Diastolic BP (mmHg) Diastolic BP (mmHg) 66   Mean BP 82   Pulse Ox % Pulse Ox % 91   Pulse Ox Activity Level  At rest   Oxygen Delivery Room Air/ 21 %   Brief Assessment:   Cardiac Regular    Respiratory clear BS    Gastrointestinal details normal Bowel sounds normal  No rebound tenderness  No gaurding  positive ascites, bs positive, non-tender   Lab Results: Hepatic:  18-Sep-13 05:22    Bilirubin, Total  18.6   Alkaline Phosphatase  237   SGPT (ALT) 28   SGOT (AST)  136   Total Protein, Serum  6.2   Albumin, Serum  1.5  Routine Chem:  18-Sep-13 05:22    Glucose, Serum 80   BUN  5   Creatinine (comp)  0.50   Sodium, Serum  135   Potassium, Serum  2.9   Chloride, Serum 98   CO2, Serum 26   Calcium (Total), Serum  7.7   Osmolality (calc) 266   eGFR (African American) >60   eGFR (Non-African American) >60 (eGFR values <37m/min/1.73 m2 may be an indication of chronic kidney disease (CKD). Calculated eGFR is useful in patients with stable renal function. The eGFR calculation will not be reliable in acutely ill patients when serum creatinine is changing rapidly. It is not useful in  patients on dialysis. The eGFR calculation may not be applicable to patients at the low and high extremes of body sizes, pregnant women, and vegetarians.)   Anion Gap 11  Routine Hem:  18-Sep-13 05:22    WBC (CBC)  13.6   RBC (CBC)  3.31   Hemoglobin (CBC)  11.0   Hematocrit (CBC)  31.8   Platelet Count (CBC)  113   MCV 96   MCH 33.4   MCHC 34.8   RDW  16.6   Neutrophil % 78.6   Lymphocyte % 7.5    Monocyte % 10.9   Eosinophil % 2.0   Basophil % 1.0   Neutrophil #  10.7   Lymphocyte # 1.0   Monocyte #  1.5   Eosinophil # 0.3   Basophil # 0.1 (Result(s) reported on 31 Oct 2011 at 06:04AM.)   Assessment/Plan:  Assessment/Plan:   Assessment 1) etoh related hepatopathy in the setting of alcohol abuse.  stble on po prednisone.  minimal asterixis.    Plan 1) continue current, no new recs.   Electronic Signatures: SLoistine Simas(MD)  (Signed 18-Sep-13 15:29)  Authored: Chief Complaint, VITAL SIGNS/ANCILLARY NOTES, Brief Assessment, Lab Results, Assessment/Plan   Last Updated: 18-Sep-13 15:29 by SLoistine Simas(MD)

## 2014-06-01 NOTE — Consult Note (Signed)
CC: jaundice.  Pt has deep jaundice, no vomiting, ate well for supper.  TB 16, WBC 25, stool neg for C. diff., abd still distended but not especially tender. VSS, WBC may be up due to steroids.  Main complaint is trouble getting a deep breath due to abd problems.  Should be offered alcohol rehab when clinically improved.  Electronic Signatures: Scot JunElliott, Kyston Gonce T (MD)  (Signed on 08-Oct-13 17:40)  Authored  Last Updated: 08-Oct-13 17:40 by Scot JunElliott, Rueben Kassim T (MD)

## 2014-06-01 NOTE — Consult Note (Signed)
Chief Complaint:   Subjective/Chief Complaint more fatigued appearing.  events noted.  no recurrent seizure.  denies n/v or abd pain.  some c/o diarrhea after meals.   VITAL SIGNS/ANCILLARY NOTES: **Vital Signs.:   19-Sep-13 04:30   Vital Signs Type Routine   Temperature Temperature (F) 98.6   Celsius 37   Temperature Source oral   Pulse Pulse 83   Respirations Respirations 18   Systolic BP Systolic BP 585   Diastolic BP (mmHg) Diastolic BP (mmHg) 65   Mean BP 78   Pulse Ox % Pulse Ox % 91   Pulse Ox Activity Level  At rest   Oxygen Delivery Room Air/ 21 %   Brief Assessment:   Cardiac Regular    Respiratory clear BS    Gastrointestinal details normal protuberant, less tense, nontender. bs positive.   Lab Results:  Hepatic:  19-Sep-13 06:29    Bilirubin, Total  17.1   Bilirubin, Direct  13.00 (Result(s) reported on 01 Nov 2011 at 07:47AM.)   Alkaline Phosphatase  231   SGPT (ALT) 27   SGOT (AST)  104   Total Protein, Serum  6.1   Albumin, Serum  1.5  Routine Chem:  19-Sep-13 06:29    Glucose, Serum 84   BUN  6   Creatinine (comp)  0.46   Sodium, Serum  134   Potassium, Serum  3.3   Chloride, Serum 100   CO2, Serum 25   Calcium (Total), Serum  7.8   Anion Gap 9   Osmolality (calc) 265   eGFR (African American) >60   eGFR (Non-African American) >60 (eGFR values <44m/min/1.73 m2 may be an indication of chronic kidney disease (CKD). Calculated eGFR is useful in patients with stable renal function. The eGFR calculation will not be reliable in acutely ill patients when serum creatinine is changing rapidly. It is not useful in  patients on dialysis. The eGFR calculation may not be applicable to patients at the low and high extremes of body sizes, pregnant women, and vegetarians.)  Routine Coag:  19-Sep-13 06:29    Prothrombin  22.9   INR 2.0 (INR reference interval applies to patients on anticoagulant therapy. A single INR therapeutic range for coumarins is  not optimal for all indications; however, the suggested range for most indications is 2.0 - 3.0. Exceptions to the INR Reference Range may include: Prosthetic heart valves, acute myocardial infarction, prevention of myocardial infarction, and combinations of aspirin and anticoagulant. The need for a higher or lower target INR must be assessed individually. Reference: The Pharmacology and Management of the Vitamin K  antagonists: the seventh ACCP Conference on Antithrombotic and Thrombolytic Therapy. CIDPOE.4235Sept:126 (3suppl): 2N9146842 A HCT value >55% may artifactually increase the PT.  In one study,  the increase was an average of 25%. Reference:  "Effect on Routine and Special Coagulation Testing Values of Citrate Anticoagulant Adjustment in Patients with High HCT Values." American Journal of Clinical Pathology 2006;126:400-405.)   Assessment/Plan:  Assessment/Plan:   Assessment 1) alcohol associated hepatitis.  positive ascites, coagulopathy.  liver tests improving, however PT some increased today.   2) seizure-?etoh related/withdrawl related? Positive previous history of similar episode.  3) etoh abuse, of note his brother died of etoh related issues. 4) diarrhea 5) h/o upper gi bleed, possibly variceal. will start nadolol 20 mg po q 5 pm    Plan 1) continue current, on po steroid.   will give vit K for 3 days. will place on daily dose of cipro  for sbp prophylaxis, check c. diff.  2) consider social service consult, patient is concerned he will be unable to get home due to  unable to get home due to lack of transportation.   Electronic Signatures: Loistine Simas (MD)  (Signed 19-Sep-13 11:24)  Authored: Chief Complaint, VITAL SIGNS/ANCILLARY NOTES, Brief Assessment, Lab Results, Assessment/Plan   Last Updated: 19-Sep-13 11:24 by Loistine Simas (MD)

## 2014-06-01 NOTE — H&P (Signed)
PATIENT NAME:  Jorge Edwards, Jorge Edwards MR#:  [id number] DATE OF BIRTH:  05-Aug-1980  DATE OF ADMISSION:  11/18/2011  REFERRING PHYSICIAN: Dr. Mindi JunkerGottlieb  FAMILY PHYSICIAN: None  REASON FOR ADMISSION: Abdominal pain with nausea and vomiting.   HISTORY OF PRESENT ILLNESS: The patient is a 34 year old male who was recently hospitalized with acute on chronic alcohol-induced hepatitis.  He was also found to have Clostridium difficile colitis.  He was discharged on prednisone, Keppra, nadolol, vancomycin, and Cipro. He was to follow up with Dr. Bluford Kaufmannh but did not. He has not been seen since he left the hospital. He presents now with recurrent abdominal pain associated with nausea and vomiting. In the Emergency Room, the patient was noted to have a bilirubin of 18.2 and he was extremely jaundiced. He had massive ascites noted on exam. He is now admitted for further evaluation.   PAST MEDICAL HISTORY:  1. Chronic alcohol-induced hepatitis.  2. History of C. difficile colitis.  3. Chronic anemia.  4. Chronic thrombocytopenia.  5. 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  6. Alcohol abuse.  7. Chronic hypokalemia.  8. History of alcohol withdrawal seizures.   MEDICATIONS:  1. Prednisone 30 mg p.o. daily.  2. Keppra 500 mg p.o. b.i.d.  3. Nadolol 20 mg p.o. daily.   ALLERGIES: No known drug allergies.   SOCIAL HISTORY: The patient denies smoking. He states that he drinks 1 to 2 beers every day. No illicit drug use.   FAMILY HISTORY: Positive for alcoholism and chronic liver disease.   REVIEW OF SYSTEMS:  CONSTITUTIONAL: No fever or change in weight. EYES: No blurred or double vision. No glaucoma. ENT: No tinnitus or hearing loss. No nasal discharge or bleeding. No difficulty swallowing. RESPIRATORY: No cough or wheezing. Denies hemoptysis. No painful respiration. CARDIOVASCULAR: No chest pain or orthopnea. No palpitations or syncope. GI: No diarrhea. GU: No dysuria or hematuria. No incontinence. ENDOCRINE: No polyuria or  polydipsia. No heat or cold intolerance. HEMATOLOGIC: The patient denies anemia, easy bruising, or bleeding. LYMPHATIC: No swollen glands.  MUSCULOSKELETAL: The patient has pain in his back but denies pain in his neck, shoulders, knees, or hips. No gout. NEUROLOGIC:  He does have weakness but no numbness. Denies migraines, stroke, and no recent seizures. PSYCH: The patient denies anxiety, insomnia, or depression.   PHYSICAL EXAMINATION:  GENERAL: The patient is in mild distress. He is obviously jaundiced.   VITAL SIGNS: Vital signs remarkable for a blood pressure of 128/75 with a heart rate of 109 and a respiratory rate of 20. Temperature is 99.   HEENT: Normocephalic, atraumatic. Pupils equally round and reactive to light and accommodation. Extraocular movements are intact. Sclerae are icteric. Conjunctivae are clear.  Oropharynx is clear.   NECK: Supple without jugular venous distention or bruits. No adenopathy or thyromegaly is noted.   LUNGS: Basilar rhonchi. No wheezes or rales. No dullness.   CARDIAC: Rapid rate with a regular rhythm. No significant rubs, murmurs, or gallops. PMI is nondisplaced.   ABDOMEN: Distended but soft. Mildly tender. No rebound or guarding. Positive fluid wave consistent with ascites was noted.   EXTREMITIES: Without clubbing, cyanosis, or edema. Pulses were 2+ bilaterally.   SKIN: Warm and dry with multiple spider nevi noted.   NEUROLOGIC: Cranial nerves II through XII grossly intact. Deep tendon reflexes were symmetric. Motor and sensory examinations nonfocal.   PSYCHIATRIC: Alert and oriented to person, place, and time. He was cooperative and used good judgment.   LABORATORY, DIAGNOSTIC, AND RADIOLOGICAL DATA: Urinalysis did reveal  1+ bacteria. Lipase was 357. Glucose was 98 with a BUN of 7 and a creatinine of 0.76 with a GFR greater than 60. Sodium was 139 with a potassium of 2.8. His bilirubin was 18.2 with an alkaline phosphatase of 307 and an ALT of 71  with an AST of 156. White count was 18.4 with a hemoglobin of 11.6 and a platelet count of 167,000.   ASSESSMENT:  1. Abdominal pain with nausea and vomiting.  2. Acute on chronic alcohol induced hepatitis.  3. Anemia of chronic disease.  4. Leukocytosis.  5. Hypokalemia.  6. History of alcohol withdrawal seizures.  7. Recent C. difficile colitis.   PLAN: The patient will be admitted to the floor as a FULL CODE and started on potassium supplementation with IV fluids. We will send off blood and urine cultures and begin IV antibiotics. We will obtain a follow-up abdominal ultrasound and compare to his last admission. We will begin CIWA protocol because of his alcoholism. We will consult GI at this time. He does have some minimal shortness of breath, so we will check an x-ray.  He will be maintained on a clear liquid diet. Follow-up routine labs in the morning. Further treatment and evaluation will depend upon the patient's progress.   TOTAL TIME SPENT: 50 minutes.  ____________________________ Jorge Lope Judithann Sheen, MD jds:bjt D: 11/18/2011 17:15:50 ET T: 11/19/2011 07:03:44 ET JOB#: [id number]  cc: Jorge Lope. Judithann Sheen, MD, <Dictator> Charli Halle Rodena Medin MD ELECTRONICALLY SIGNED 11/19/2011 11:41

## 2014-06-01 NOTE — Consult Note (Signed)
PATIENT NAME:  Jorge Edwards, ACKLIN MR#:  [id number] DATE OF BIRTH:  07/18/1980  DATE OF CONSULTATION:  11/19/2011  REFERRING PHYSICIAN:   CONSULTING PHYSICIAN:  Scot Jun, MD  HISTORY: The patient is a 34 year old Hispanic male, [place] has been in the [place] for about 13 years, recently moved down here from [place] area. He was hospitalized last month for obvious alcoholic hepatitis with a distended abdomen, deep jaundice. He also was noted to have C-diff.  He presented to the ER with massive ascites, severe jaundice, abdominal pain, nausea and vomiting and was admitted to the hospital. I was asked to see him in consultation.  The patient says he started drinking about age 88. Other members of the family are alcoholics including his brother. He had a seizure last admission. He has not had any since then.   SUPPOSED OUTPATIENT MEDICATIONS:  1. Prednisone 30 mg a day.  2. Keppra 500 mg b.i.d.  3. Nadolol 20 mg daily.   ALLERGIES: No known drug allergies.   HABITS: She does not smoke. Drinks rum and beer. He has one DUI in [place].   PAST SURGICAL HISTORY: No prior surgery.   REVIEW OF SYSTEMS: He has mushy but not watery stools, nausea, has diffuse abdominal pain all over, especially in the suprapubic area. He sometimes seems like he has a fever at night. He denies any chills. He does have scrotal swelling as well as abdominal swelling. He has infrequent headaches, lots of itching. No chest pains. No shortness of breath. No cough, wheezing, or hemoptysis. No gastrointestinal bleeding. No dysuria or hematuria. No incontinence. No polyuria or polydipsia. He has pain in his back.   PHYSICAL EXAMINATION:  VITAL SIGNS: Temperature 100, pulse 94, blood pressure 122/74, oxygen sat 96%.  GENERAL:  Hispanic male with obvious jaundice.   HEENT: Sclerae deeply icteric. Conjunctivae negative.   HEAD: Atraumatic. Trachea is in the midline. Tongue negative.   CHEST:  Clear anterolateral fields.   HEART: No murmurs or gallops I can hear.   ABDOMEN: Bowel sounds are present. There is generalized swelling of the abdomen. There is diffuse mild tenderness to palpation and percussion. Skin is deeply jaundiced. The liver is felt about 3 to 4 fingerbreadths below the right costal margin.   EXTREMITIES: No edema.   SKIN: Multiple spider nevi present. The patient was awake and alert and a fairly good historian.   LABORATORY, DIAGNOSTIC, AND RADIOLOGICAL DATA: Ultrasound report pending. Urinalysis shows 1+ bacteria. Lipase 357, glucose 98, BUN 7, creatinine 0.76, sodium 139, potassium 2.8, total bilirubin 18.2, alkaline phosphatase 307, ALT 71, AST 156. White count 18.4, hemoglobin 11.6, platelet count 167.   ASSESSMENT: The patient is with abdominal pain, ascites and hepatomegaly. He appears to have alcoholic hepatitis with severe jaundice. The patient was discussed with Dr. Marva Panda who saw the patient previously. I will follow the patient this time.   RECOMMENDATIONS: Agree with cover with broad-spectrum antibiotic given his ascites and abdominal pain because of the significant possibility that he has possible spontaneous bacterial peritonitis. I would check his stools for Clostridium difficile. He does not have watery diarrhea. It is mushy and his C. difficile and may have been successfully treated with vancomycin. I would start him on nadolol again. I would go ahead and give him prednisone 30 mg a day orally for his alcohol hepatitis. I would recommend Keppra because of history of seizures previous visit and I will follow with you. No endoscopy planned at  this time.  ____________________________ Scot Junobert T. Manmeet Arzola, MD rte:ap D: 11/19/2011 07:57:53 ET             T: 11/19/2011 08:16:22 ET               JOB#: [id number] cc: Sheryl L. Mindi JunkerGottlieb, MD Duane LopeJeffrey D. Judithann SheenSparks, MD Scot JunOBERT T Justen Fonda MD ELECTRONICALLY SIGNED 11/24/2011 11:29

## 2014-06-01 NOTE — Consult Note (Signed)
Chief Complaint:   Subjective/Chief Complaint no nausea or vomiting, minimal abdominal discomfort, patietn states his abdomen is less swollen. still with loose stool.   VITAL SIGNS/ANCILLARY NOTES: **Vital Signs.:   20-Sep-13 04:57   Vital Signs Type Routine   Temperature Temperature (F) 98.4   Celsius 36.8   Temperature Source Oral   Pulse Pulse 73   Respirations Respirations 18   Systolic BP Systolic BP 606   Diastolic BP (mmHg) Diastolic BP (mmHg) 65   Mean BP 78   Pulse Ox % Pulse Ox % 93   Pulse Ox Activity Level  At rest   Oxygen Delivery Room Air/ 21 %   Brief Assessment:   Cardiac Regular    Respiratory clear BS    Gastrointestinal details normal Nontender  Bowel sounds normal  moderate distension/ascites, liver firm, nodular border, easily palpated.   Lab Results:  Hepatic:  17-Sep-13 13:22    Bilirubin, Total  19.8   Alkaline Phosphatase  283   SGPT (ALT) 32   SGOT (AST)  188  18-Sep-13 05:22    Bilirubin, Total  18.6   Alkaline Phosphatase  237   SGPT (ALT) 28   SGOT (AST)  136  19-Sep-13 06:29    Bilirubin, Total  17.1   Alkaline Phosphatase  231   SGPT (ALT) 27   SGOT (AST)  104  20-Sep-13 04:40    Bilirubin, Total  19.4   Alkaline Phosphatase  247   SGPT (ALT) 28   SGOT (AST)  91   Total Protein, Serum 6.7   Albumin, Serum  1.6  Routine Chem:  19-Sep-13 13:39    Result Comment POSITIVE C. DIFF - NOTIFIED OF CRITICAL VALUE  - C/COESHA MEADOWS AT 1521 11/01/11.PMH  - READ-BACK PROCESS PERFORMED.  Result(s) reported on 01 Nov 2011 at 03:21PM.  20-Sep-13 04:40    Glucose, Serum 68   BUN  6   Creatinine (comp)  0.46   Sodium, Serum 139   Potassium, Serum  3.4   Chloride, Serum 105   CO2, Serum 24   Calcium (Total), Serum  8.3   Osmolality (calc) 273   eGFR (African American) >60   eGFR (Non-African American) >60 (eGFR values <63m/min/1.73 m2 may be an indication of chronic kidney disease (CKD). Calculated eGFR is useful in patients with  stable renal function. The eGFR calculation will not be reliable in acutely ill patients when serum creatinine is changing rapidly. It is not useful in  patients on dialysis. The eGFR calculation may not be applicable to patients at the low and high extremes of body sizes, pregnant women, and vegetarians.)   Anion Gap 10  Routine Coag:  17-Sep-13 13:22    INR 1.7 (INR reference interval applies to patients on anticoagulant therapy. A single INR therapeutic range for coumarins is not optimal for all indications; however, the suggested range for most indications is 2.0 - 3.0. Exceptions to the INR Reference Range may include: Prosthetic heart valves, acute myocardial infarction, prevention of myocardial infarction, and combinations of aspirin and anticoagulant. The need for a higher or lower target INR must be assessed individually. Reference: The Pharmacology and Management of the Vitamin K  antagonists: the seventh ACCP Conference on Antithrombotic and Thrombolytic Therapy. CTKZSW.1093Sept:126 (3suppl): [id number] A HCT value >55% may artifactually increase the PT.  In one study,  the increase was an average of 25%. Reference:  "Effect on Routine and Special Coagulation Testing Values of Citrate Anticoagulant Adjustment in Patients with High HCT  Values." American Journal of Clinical Pathology 3419;379:024-097.)  19-Sep-13 06:29    INR 2.0 (INR reference interval applies to patients on anticoagulant therapy. A single INR therapeutic range for coumarins is not optimal for all indications; however, the suggested range for most indications is 2.0 - 3.0. Exceptions to the INR Reference Range may include: Prosthetic heart valves, acute myocardial infarction, prevention of myocardial infarction, and combinations of aspirin and anticoagulant. The need for a higher or lower target INR must be assessed individually. Reference: The Pharmacology and Management of the Vitamin K  antagonists:  the seventh ACCP Conference on Antithrombotic and Thrombolytic Therapy. DZHGD.9242 Sept:126 (3suppl): [id number]. A HCT value >55% may artifactually increase the PT.  In one study,  the increase was an average of 25%. Reference:  "Effect on Routine and Special Coagulation Testing Values of Citrate Anticoagulant Adjustment in Patients with High HCT Values." American Journal of Clinical Pathology 2006;126:400-405.)  20-Sep-13 04:40    Prothrombin  19.8   INR 1.6 (INR reference interval applies to patients on anticoagulant therapy. A single INR therapeutic range for coumarins is not optimal for all indications; however, the suggested range for most indications is 2.0 - 3.0. Exceptions to the INR Reference Range may include: Prosthetic heart valves, acute myocardial infarction, prevention of myocardial infarction, and combinations of aspirin and anticoagulant. The need for a higher or lower target INR must be assessed individually. Reference: The Pharmacology and Management of the Vitamin K  antagonists: the seventh ACCP Conference on Antithrombotic and Thrombolytic Therapy. ASTMH.9622 Sept:126 (3suppl): [id number]. A HCT value >55% may artifactually increase the PT.  In one study,  the increase was an average of 25%. Reference:  "Effect on Routine and Special Coagulation Testing Values of Citrate Anticoagulant Adjustment in Patients with High HCT Values." American Journal of Clinical Pathology 2006;126:400-405.)   Assessment/Plan:  Assessment/Plan:   Assessment 1) Etoh related hepatitis, cirrhosis.  stable on prednisone.  improvement in hepatocellular enzymes and PT is vit K responsive.  less ascites.  On daily dose of cipro for sbp prophylaxis.  with some increase of bili and sl increase AP, will change cipro to twice a week. On low dose nadolol due to h/o upper GI bleeding 6 months ago, likely variceal.  2) positive C. diff-on metronidazole.    Plan 1) continue daily lfts and pt.   Recommend ambulating patient as possible . finish 10 day course of metronidazole.  continuye nadolol.  Continue prednisone at current dose, will start taper as o/p. Recommend GI follow up in 2 weeks.  I will ask Dr Candace Cruise to see over the weekend.   Electronic Signatures: Loistine Simas (MD)  (Signed 20-Sep-13 14:58)  Authored: Chief Complaint, VITAL SIGNS/ANCILLARY NOTES, Brief Assessment, Lab Results, Assessment/Plan   Last Updated: 20-Sep-13 14:58 by Loistine Simas (MD)

## 2014-06-01 NOTE — Consult Note (Signed)
Chief Complaint:   Subjective/Chief Complaint Patietn seen and examined, full consult to follow.  Patietn admitted with abdominal distension/ascites, and right sided abdominal pain, abnormal lfts in the setting of daily etoh abuse.  H/O hospitalization 6 months ago in [place] with hematemesis.  Currently no evidence of GI bleeding.  Mild asterixis.  Agree with po steroids as you are, positive Maddreys criteria.  Daily PT and lfts/met b.  Awaiting result of abd [place] pending. Discussed with Dr Verdell Carmine.  Following.   Brief Assessment:   Cardiac Regular    Respiratory clear BS    Gastrointestinal details normal Bowel sounds normal  No rebound tenderness  distended/ascites, generalized tenderness, mostly right abdomen.   Electronic Signatures: Loistine Simas (MD)  (Signed 17-Sep-13 18:01)  Authored: Chief Complaint, Brief Assessment   Last Updated: 17-Sep-13 18:01 by Loistine Simas (MD)

## 2014-06-01 NOTE — Consult Note (Signed)
Pt CC: jaundice.  Pt TB falling some, down to 13.7.  WBC is 19.  VSS, afebrile.  Less tremor today. Abd about same.  Slight ankle edema.  Pt may be able to go home in a day or two.  [place].  I explained to him that the treatments we are doing for him can be done as out patient except iv fluids.  He has a high chance of recurrent alcohol use.  He should go home on prednisone taper over a month.    Electronic Signatures: Scot JunElliott, Terrell Ostrand T (MD)  (Signed on 10-Oct-13 16:52)  Authored  Last Updated: 10-Oct-13 16:52 by Scot JunElliott, Aemon Koeller T (MD)

## 2014-06-01 NOTE — Discharge Summary (Signed)
PATIENT NAME:  Jorge Edwards, Jorge Edwards Edwards MR#:  [id number] DATE OF BIRTH:  05-26-1980  DATE OF ADMISSION:  11/18/2011 DATE OF DISCHARGE:  11/23/2011  PRIMARY CARE PHYSICIAN:  None.   DISCHARGE DIAGNOSES:  1. Subacute bacterial peritonitis.  2. Alcoholic cirrhosis. 3. Ascites. 4. Jaundice.   HISTORY OF PRESENT ILLNESS: The patient is a 34 year old male with recent hospitalization with chronic alcohol-induced hepatitis and C-diff. He was discharged on prednisone, Keppra, Nadolol, vancomycin and ciprofloxacin with advice to follow up with Dr. Bluford Kaufmann, but he did not. He had not been seen since he left the hospital which was two weeks ago. He presented with recurrent abdominal pain associated with nausea and vomiting and having worsening of his jaundice. In the Emergency Room his bilirubin was found to be 18.2 and he had severe icteric finding with massive ascites on exam, so he was admitted for evaluation and management.   HOSPITAL COURSE: After admission gastroenterology consult was done and Dr. Mechele Collin, gastroenterologist, suggested not to do any further work-up for a gastrointestinal point of view and continue supportive care for his possible SBP. We continued him on antibiotics, on Prednisone, on lactulose, replacing his electrolytes and Nadolol. We did not give any vancomycin because his stool for C. difficile this time were negative and he successfully finished his treatment after last admission. During the hospital course he remained mostly stable with some intermittent complaint of abdominal pain, which was controlled by oral pain medication and after that, he felt significantly better. His liver function tests were slowly but gradually improving. He had complaint of severe itching which was controlled with Benadryl and topical steroid application. He was tolerating diet well after a few days and then finally decided to discharge him home with advice to follow up in [facility]. Other medical issues handled  during this hospital stay: 1. Hepatitis A antibody was positive as per the previous admission. IgM was negative, so we assumed that it is possibly the old infection in the past.  2. Alcoholism. He was a known case of alcoholism and liver cirrhosis secondary to that. He continued drinking in spite of that. He was advised to stop drinking and offered psychiatry support, but he refused to go for inpatient or outpatient rehab. Still on discharge care manager provided him numbers in case he changes his mind and he wanted to follow in outpatient alcohol rehab Edwards. During the hospital stay there have been no signs of alcohol withdrawal and he was managed with CIWA protocol.  3. Seizures, possibly alcohol withdrawal as was suspected in the last admission, but he was discharged with Keppra, so we continued same.  4. Poor nutrition. We were supplementing his vitamins and his electrolytes during the hospital stay and was advised to continue after discharge.  5. Hypokalemia and hypomagnesemia were found during the hospital stay. We replaced and also gave prescriptions on discharge.   6. Jaundice. He is also noncompliant with his alcohol abstinence and that is why he is very likely to have repeated admissions in the hospital.  7. CODE STATUS: FULL CODE.   CONDITION ON DISCHARGE: Guarded as he has abdominal distention and weakness persistent which will be there because of liver failure and he will improve very slowly.   DISCHARGE MEDICATIONS:  1. Keppra 500 mg 2 times a day.  2. Nadolol 20 mg oral tablet once a day.  3. Prednisone 10 mg tablets 3 tablets once a day for five days, then 2 tablets daily for 10 days and then 1  tablet daily for 15 days, to be taken for 30 days and slowly tapering as per advised by gastroenterology. 4. Morphine 15 mg 12 hour tablets 1 tablet orally every 12 hours given for five days.  5. Potassium chloride 20 mEq once a day.  6. Folic acid 1 mg once a day. 7. Thiamine 100 mg once a  day. 8. Bactrim Double Strength 800 mg/160 mg oral tablet once a day for 30 days. 9. Hydrocortisone 1% ointment to be applied on skin three times a day.  10. Furosemide 20 mg oral tablet once a day.   11. Magnesium oxide 500 mg tablet to be taken once a day.  12. He was also given prescription for lactulose to be taken 30 grams every day to prevent him from hepatic encephalopathy.   HOME HEALTH: No.   HOME OXYGEN: No.   DIET: Low fat and low cholesterol, regular consistency, easy to digest, more liquids and fruits and less oily and proteinaceous diet.   ACTIVITY LIMITATIONS: No exertional activity.   FOLLOWUP: He was advised to follow up within 1 to 2 weeks with [facility]. Care manager provided with the numbers with [facility] also.   TOTAL TIME SPENT IN DISCHARGE: 45 minutes.    ____________________________ Jorge Edwards [name], MD vgv:ap D: 11/23/2011 16:46:47 ET T: 11/24/2011 09:55:43 ET JOB#: [id number]  cc: Jorge Edwards [name], MD, <Dictator> Jorge Edwards Jorge Edwards Edwards [facility] LtdVAIBHAVKUMAR Pattiann Solanki MD ELECTRONICALLY SIGNED 12/04/2011 18:13

## 2014-06-01 NOTE — H&P (Signed)
PATIENT NAME:  Jorge Edwards, Jorge Edwards MR#:  [id number] DATE OF BIRTH:  1980-11-02  DATE OF ADMISSION:  10/30/2011  PRIMARY CARE PHYSICIAN:  None.  CHIEF COMPLAINT: Abdominal pain, nausea, and vomiting.   HISTORY OF PRESENT ILLNESS: This is a [age] year old male who presents to the Emergency Room today secondary to abdominal pain, nausea, and vomiting ongoing for the past week or so. He has also noted that his abdomen started to get a bit distended and that his eyes were starting to turn yellow. He went to some clinic near his workplace although they were not able to help him according to him. He therefore since he was not improving came to the ER for further evaluation. In the Emergency Room, the patient was noted to be significantly jaundiced with significant hyperbilirubinemia and noted to have abnormal liver function tests with coagulopathy secondary to probably acute hepatitis. Hospitalist service was contacted for further treatment and evaluation.   REVIEW OF SYSTEMS: CONSTITUTIONAL: No documented fever. No weight gain or weight loss. EYES: No blurred or double vision. ENT: No tinnitus. No postnasal drip. No redness of the oropharynx. RESPIRATORY: No cough, no wheeze, no hemoptysis. Positive dyspnea. CARDIOVASCULAR: No chest pain, no orthopnea, no palpitations, no syncope.  GI: Positive nausea. Positive vomiting. Positive generalized abdominal pain. No melena or hematochezia. GU: No dysuria or hematuria. ENDOCRINE: No polyuria or nocturia. No heat or cold intolerance. HEME: No anemia, no bruising, no bleeding. INTEGUMENT: No rashes. No lesions. MUSCULOSKELETAL: No arthritis, no swelling, no gout. NEUROLOGIC: No numbness, no tingling, no ataxia, no seizure-type activity. PSYCH: No anxiety, no insomnia, no ADD.   PAST MEDICAL HISTORY: No significant past medical history.   ALLERGIES: No known drug allergies.   SOCIAL HISTORY: No smoking. He does drink about a few beers every day, has last drink yesterday.  According to him he drinks about 3 to 4 beers a day, maybe on weekends he drinks a bit more. No illicit drug abuse.   FAMILY HISTORY: His brother also had a history of alcoholism and died from chronic liver disease. Mother is alive and healthy. He cannot recall his father's medical history.   CURRENT MEDICATIONS:  He is currently on no medications.   PHYSICAL EXAMINATION ON ADMISSION:  VITAL SIGNS: Temperature 98.1, pulse 86, respirations 21, blood pressure 117/75, sats 96% on room air.   GENERAL: He is a pleasant appearing male in mild distress.   HEENT: Atraumatic, normocephalic. His extraocular muscles are intact. His pupils are equal and reactive to light. Sclerae icteric. No conjunctival injection. No oropharyngeal erythema.   NECK: Supple. No jugular venous distention. No bruits, no lymphadenopathy, no thyromegaly.   HEART: Regular rate and rhythm. Tachycardic. No murmurs,  no rubs, no clicks.   LUNGS: Clear to auscultation bilaterally. No rales, no rhonchi, no wheezes.   ABDOMEN: Soft, although distended. Positive hepatomegaly. Good bowel sounds. He does have positive fluid waves consistent with ascites. No rebound. No rigidity. Positive right upper quadrant tenderness.   EXTREMITIES: No evidence of any cyanosis, clubbing, or peripheral edema. Has +2 pedal and radial pulses bilaterally.   NEUROLOGIC: He is alert, awake, and oriented times three with no focal motor or sensory deficits appreciated bilaterally.   SKIN: Moist, warm. He does have multiple spider nevi noted throughout his abdomen and his chest.   LYMPHATIC: There is no cervical or axillary lymphadenopathy.   LABORATORY, DIAGNOSTIC, AND RADIOLOGICAL DATA:  Serum glucose 93, BUN 4, creatinine 0.3, sodium 130, potassium 3.3, chloride 95, bicarbonate 25. LFTs  showed a total bilirubin of 19.8, albumin of 1.8, alkaline phosphatase 283, AST 188, ALT 32. White cell count 12.9, hemoglobin 11.3, hematocrit 33.1, platelet count  135. Prothrombin time 20.7, INR 1.7.   ASSESSMENT AND PLAN: This is a 34 year old male with history of alcohol abuse who presents to the hospital due to abdominal pain, nausea, and vomiting, and noted to be severely jaundiced with abnormal liver function tests. 1. Acute hepatitis: This is likely acute alcoholic hepatitis. He does have a history of alcohol abuse. His discriminate factor is greater than 30 at 47; therefore I will start him on p.o. steroids, follow his LFTs. He is to abstain from alcohol. I discussed the case with Dr. Marva Panda from GI who will also evaluate the patient. We will check viral serologies for hepatitis. We will get an abdominal ultrasound, too.  2. Abdominal pain, nausea, and vomiting: This is likely secondary to alcoholic hepatitis. Place him on a clear liquid diet. Continue supportive care with IV fluids, antiemetics, and pain control for now.  3. Anemia and thrombocytopenia: This is likely related to his alcohol abuse and liver disease. There is no evidence of any acute bleeding. We will follow his counts.  4. Acquired coagulopathy: This is likely secondary to liver disease. As mentioned, no acute bleeding. We will follow his coags.   CODE STATUS: The patient is a FULL CODE.  TIME SPENT WITH ADMISSION: 50 minutes.     ____________________________ Rolly Pancake. Cherlynn Kaiser, MD vjs:bjt D: 10/30/2011 16:47:20 ET T: 10/30/2011 17:11:01 ET JOB#: [id number]  cc: Rolly Pancake. Cherlynn Kaiser, MD, <Dictator> [place] Siren MD ELECTRONICALLY SIGNED 11/09/2011 12:16

## 2014-06-01 NOTE — Consult Note (Signed)
UV:[id number]:alcoholic hepatitis.  Pt with severe itching due to elevated bilirubin.  VSS afebrile, chest clear, abd with distention, bowel sounds active. Notified nurse to give him benadryl now.  No new recommendations.  Electronic Signatures: Scot JunElliott, Koryn Charlot T (MD)  (Signed on 09-Oct-13 18:00)  Authored  Last Updated: 09-Oct-13 18:00 by Scot JunElliott, Devarius Nelles T (MD)

## 2014-06-01 NOTE — Consult Note (Signed)
PATIENT NAME:  Jorge Edwards, Jorge Edwards MR#:  [id number] DATE OF BIRTH:  1981-01-14  DATE OF CONSULTATION:  11/21/2011  REFERRING PHYSICIAN:  Dr. Elisabeth Pigeon  CONSULTING PHYSICIAN:  Doralee Albino. Maryruth Bun, MD  REASON FOR CONSULTATION: Alcohol dependency.  IDENTIFYING INFORMATION: Jorge Edwards is a 34 year old currently separated Hispanic male living in the [place] area with his mother. He has been unemployed for the past one month secondary to medical problems.   HISTORY OF PRESENT ILLNESS: Jorge Edwards is a 34 year old currently separated Hispanic male with a history of chronic alcohol-induced hepatitis, history of C. difficile colitis recently, anemia, and thrombocytopenia who was admitted to the medicine service with abdominal pain and hyperbilirubinemia. The patient had a total bilirubin of 18.2 and was extremely jaundiced at the time of admission. He also had massive ascites at the time of admission. The patient does admit to currently drinking alcohol 3 to 4 beers on a daily basis but has cut down from drinking one bottle of Bacardi rum daily. He has been drinking alcohol heavily over the past six years since his girlfriend separated from him when he was in a residential rehab facility at the time. His ex-girlfriend and 72-year-old son with his ex-girlfriend currently live in [place]. 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 difficile colitis. The patient says that due to the ascites and distention of his stomach he cannot bend over in order to do landscaping work. In addition, he is also having a lot of problems with chronic itching, most likely from the hyperbilirubinemia. He denies any specific triggers for the drinking but says that he has a difficult time stopping when he does start to drink. He does have three brothers who are heavy alcoholics, and one brother who died of alcohol-related illness. The patient denies any illicit drug  use including cocaine, cannabis, opiate, or stimulant use. He does report some mild depressive symptoms including feelings of helplessness and frequent crying spells as well as decreased energy level. He says that most of his depressive symptoms are coming from not being able to work secondary to medical problems as he is not able to support his mother. The patient says that he wants to be able to return to [place] as he does not feel like things have been working out for him here. He denies any current suicidal thoughts or history of suicide attempts in the past. He denies any prior inpatient psychiatric hospitalizations. He denies any history of any prior psychotic symptoms including auditory or visual hallucinations. No paranoid thoughts or delusions.   PAST PSYCHIATRIC HISTORY: The patient does have a history of being to five inpatient rehab facilities before with the longest rehab time being 18 months. The patient says his longest period of sobriety was 18 months when in that facility; that was the time when his girlfriend separated from him. He did see a psychiatrist when in rehab but denies ever being on any psychotropic medications in the past. He is not currently interested in or wanting to take any psychotropic medications. He denies any prior suicide attempts or inpatient psychiatric hospitalizations that were not related to substance use.   SUBSTANCE USE HISTORY: As stated in the history of present illness, the patient does have a history of alcohol dependence for over six years as well as a history of alcohol withdrawal related seizures. He denies any history of any DTs. He denies any cocaine, cannabis, opiate,  or stimulant use. He denies any tobacco use.   FAMILY PSYCHIATRIC HISTORY: The patient says his brother died of alcohol-related illness. He also has three other brothers that he said drink alcohol heavily.   PAST MEDICAL HISTORY:  1. Recent C. difficile colitis.  2. Chronic  alcohol-induced hepatitis. 3. Chronic anemia. 4. Chronic thrombocytopenia. 5. 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 6. Chronic hypokalemia. 7. History of alcohol withdrawal seizures.  8. He denies any history of any prior TBI.   CURRENT MEDICATIONS: 1. Rocephin 1 gram IV q.12 hours.  2. Folic acid 1 tab p.o. daily.  3. Keppra 500 mg p.o. every 12 hours.  4. Magnesium oxide 400 mg p.o. t.i.d.  5. Multivitamin 1 tablet p.o. daily.  6. Nadolol 20 mg p.o. daily.  7. Pantoprazole 40 mg p.o. b.i.d.  8. Thiamine 100 mg p.o. daily.  9. The patient had been on Ativan per [facility]WA as well.   ALLERGIES: No known drug allergies.   SOCIAL HISTORY: The patient was born in [place] and came to the [place] in [place]. He was living in [place] and recently came to [place]. 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 He has never been married but was with his girlfriend for several years. They have an 33-year-old son together.  His ex-girlfriend and 33-year-old son live in [place].   LEGAL HISTORY: The patient does have a history of a DUI in 2006.   MENTAL STATUS EXAM: Jorge Edwards is a 34 year old Hispanic male with a six-year history of alcohol dependence. He was sitting up in his hospital bed in his hospital gown. Abdomen was grossly distended. The patient appeared jaundiced. He was fully alert and oriented to time, place, and situation. No visible tremors noted. Speech was regular rate and rhythm but soft, fluent, and coherent. Mood was described as being "okay". Affect was sad and depressed looking. Thought processes were linear, logical, and goal-directed. He denied any current suicidal or homicidal thoughts. He denied any current auditory or visual hallucinations. He denied any paranoid thoughts or delusions. Attention and concentration were fairly good. Recall was three out of three initially  and three out of three after five minutes. He could name the presidents backwards to [place]. He was not able to understand some simple proverbs, however. The patient could do simple calculations but had difficulty with serial sevens.  SUICIDE RISK ASSESSMENT: At this time the patient denies any current suicidal thoughts but due to heavy alcohol use he remains at a moderately elevated risk of harm to self and others on a chronic level. He denies any access to guns. At this time he is not wanting to participate in  inpatient or outpatient substance abuse treatment.     REVIEW OF SYSTEMS: CONSTITUTIONAL: He denies any weakness, but does complain of some fatigue and weight gain due to ascites. He denies any fever, chills, or night sweats. HEAD: He denies headaches or dizziness. EYES: He denies any diplopia or blurred vision but is clearly jaundiced.  ENT: He denies any neck pain or throat pain. He denies any difficulty hearing. RESPIRATORY: He denies any shortness of breath or cough. CARDIOVASCULAR: He denies any chest pain or orthopnea. GI: He does complain of nausea and abdominal pain. No vomiting today. He did have some diarrhea two days ago. ENDOCRINE: He denies heat or cold intolerance. LYMPHATIC: He denies anemia  or easy bruising. MUSCULOSKELETAL: He denies any muscle or joint pain. NEUROLOGIC: He denies any tingling or weakness. Gait is slow but steady. PSYCHIATRIC: Please see history of present illness.     PHYSICAL EXAMINATION:  VITAL SIGNS: Blood pressure 120/70, heart rate 83, respirations 18, temperature 98, pulse ox  94% on room air. Please see initial physical exam as completed by Dr. Aram Beecham.  LABORATORY, DIAGNOSTIC, AND RADIOLOGICAL DATA: Sodium 137, potassium 3.8, chloride 109, CO2 17, BUN 10, creatinine 0.48, magnesium 1.6, alkaline phosphatase 240, AST 83, ALT 50, white blood cell count 25.5, hemoglobin 10.9, platelet count 148. C. Difficile was  negative. Blood cultures times two  were negative.  Lactic acid was 2.7 on 11/18/2011. Acetaminophen level less than two. Urine tox screen was not completed.   DIAGNOSES:  AXIS I:  Alcohol dependence, depressive disorder not otherwise specified, rule out substance-induced mood disorder.   AXIS II: Deferred.   AXIS III: Chronic alcohol-related hepatitis, thrombocytopenia, and anemia. Recent C. difficile  infection.   AXIS IV: Severe. Financial problems, occupational problems, chronic multiple medical conditions, history of legal problems.   AXIS V: GAF at present equals 50.   ASSESSMENT AND TREATMENT RECOMMENDATIONS: Mr.  Edwards is a 34 year old currently separated Hispanic male with a history of alcohol dependence who was admitted to the medicine service with abdominal pain and nausea as well as hyperbilirubinemia. The patient was placed on Ativan per CIWA initially but has not had any significant withdrawal symptoms. He currently denies any shakes or tremors and there has been no alteration in mental status. Cognition was grossly intact. Psychiatry was consulted to help with transitioning the patient into a substance abuse treatment program. At this time the patient is refusing outpatient substance abuse treatment program as well as an inpatient substance abuse treatment. He says he has been to residential programs at least 5 times in the past and he knows what he needs to do, which is to cut down drinking. He wants to be at home with his mother and says he has no transportation to get to outpatient substance abuse treatment programs. Even though he is admitting to some mild depressive symptoms he is opposed to taking any psychotropic medications because he feels like his depression is due to his stated medical problems. We will consult with Theodosia Paling, psychiatric social worker, to see if the patient can get an intake appointment at [facility] psychiatry arranged where this is an IOP substance abuse treatment program.  Maybe if the patient  did have transportation and initially went to the program and had a positive experience he may be more willing to go back. At this time he is not an imminent danger to himself or others necessitating inpatient psychiatric hospitalization.Case discussed with Dr. Elisabeth Pigeon.     ____________________________ Doralee Albino. Maryruth Bun, MD akk:bjt D: 11/21/2011 12:48:43 ET T: 11/21/2011 13:09:53 ET JOB#: [id number]  cc: Lannette Avellino K. Maryruth Bun, MD, <Dictator> Darliss Ridgel MD ELECTRONICALLY SIGNED 11/22/2011 12:42

## 2014-06-01 NOTE — Discharge Summary (Signed)
PATIENT NAME:  Jorge Edwards, Kandon MR#:  [id number] DATE OF BIRTH:  [birth year]  DATE OF ADMISSION:  10/30/2011 DATE OF DISCHARGE:  11/04/2011  DIAGNOSES:  1. Acute and chronic alcohol-induced hepatitis. 2. Nausea and vomiting due to above. 3. Clostridium difficile colitis.  4. Anemia.  5. Thrombocytopenia.  6. Coagulopathy.  7. Hypokalemia. 8. Ongoing alcohol abuse. 9. Seizures, likely alcohol withdrawal seizures.   DISPOSITION: The patient is being discharged home. He will follow-up at the [facility] and also Dr. Marva PandaSkulskie in 1 to 2 weeks after discharge.   DIET: Low sodium.   ACTIVITY: As tolerated.   DISCHARGE MEDICATIONS:  1. Prednisone 30 mg daily. 2. Keppra 500 mg b.i.d.  3. Nadolol 20 mg daily.  4. Vancomycin 250 mg q.6 hours. 5. Ciprofloxacin 500 mg twice a week on Sunday and Wednesday.   CONSULTATIONS: Gastroenterology consultation with Dr. Marva PandaSkulskie and Dr. Bluford Kaufmannh.   LABORATORY, DIAGNOSTIC AND RADIOLOGICAL DATA: EEG was normal. Abdominal ultrasound showed cirrhosis, mild splenomegaly. Cholelithiasis with sludge in the gallbladder. No evidence of cholecystitis. EKG: Sinus tachycardia. Hepatitis panel was positive for hepatitis A antibody. Hepatitis B and C were negative. C-diff positive. INR was 1.2 to 1.7. White count 12.9 to 17.2, hemoglobin 11.3, platelets 113 to 198, sodium 132 on admission, normal by the time of discharge, potassium 3.3 on admission, normal by the time of discharge. Creatinine 0.34, normal glucose, low calcium likely due to low albumin and plasma ammonia 46, bilirubin 19.8 on admission, 18.9 by the time of discharge. ALP ranging from 283 to 238. AST ranging from 188 to 62, normal ALT.   HOSPITAL COURSE: The patient is a 34 year old male with history of alcohol abuse who presented with abdominal pain, nausea and vomiting. He was found to have severe jaundice and elevated liver function tests. He was found to have acute and chronic hepatitis, likely related to  alcohol abuse. He had had prior hospitalization for alcohol abuse in the past. Hepatitis profile was negative other than being positive for hepatitis A antibodies. Abdominal ultrasound showed liver cirrhosis and gallstones with sludge but no cholecystitis. A gastroenterology consultation with Dr. Marva PandaSkulskie was obtained and the patient was started on oral steroids because of discriminant factor was more than 30. Dr. Marva PandaSkulskie recommended the patient be discharged home on this dose of prednisone. He should have outpatient follow up and the dose of prednisone will be tapered as an outpatient. He has been also started on Nadolol due to his history of upper gastrointestinal bleeding and varices and on Cipro too for SBP prophylaxis. He has been aggressively counseled about alcohol cessation. The patient also complained of diarrhea and was found to be C. difficile positive. He was empirically treated with Flagyl, however, continued to have loose stool. Therefore, he has been switched to oral vancomycin. He has anemia and thrombocytopenia with coagulopathy, likely due to alcohol-induced liver disease/cirrhosis. He received three does of oral Vitamin K. His potassium was aggressively supplemented. He also had a seizure on presentation which was felt to be alcohol withdrawal. His EEG was normal. He is being discharged home on oral Keppra. The patient has been counseled about medication and outpatient followup compliance as well as about abstinence from alcohol.   TIME SPENT: 45 minutes.   ____________________________ Darrick MeigsSangeeta Chord Takahashi, MD sp:ap D: 11/04/2011 13:08:32 ET T: 11/06/2011 10:28:03 ET JOB#: [id number]  cc: Darrick MeigsSangeeta Teresa Lemmerman, MD, <Dictator> [facility] Christena DeemMartin U. Skulskie, MD Darrick MeigsSANGEETA Sansa Alkema MD ELECTRONICALLY SIGNED 11/07/2011 7:19
